# Patient Record
Sex: Female | Born: 1975 | Race: Black or African American | Hispanic: No | Marital: Single | Smoking: Never smoker
Health system: Southern US, Community
[De-identification: ages and names within clinical notes are randomized; demographics above are authoritative.]

## PROBLEM LIST (undated history)

## (undated) DIAGNOSIS — D649 Anemia, unspecified: Secondary | ICD-10-CM

## (undated) HISTORY — DX: Anemia, unspecified: D64.9

## (undated) HISTORY — PX: TUBAL LIGATION: SHX77

## (undated) HISTORY — PX: NO PAST SURGERIES: SHX2092

---

## 2004-05-26 ENCOUNTER — Emergency Department: Payer: Self-pay | Admitting: Internal Medicine

## 2005-01-28 ENCOUNTER — Emergency Department: Payer: Self-pay | Admitting: Emergency Medicine

## 2005-03-18 ENCOUNTER — Ambulatory Visit (HOSPITAL_COMMUNITY): Admission: AD | Admit: 2005-03-18 | Discharge: 2005-03-18 | Payer: Self-pay | Admitting: Obstetrics & Gynecology

## 2005-05-13 ENCOUNTER — Emergency Department: Payer: Self-pay | Admitting: Emergency Medicine

## 2005-06-01 ENCOUNTER — Emergency Department (HOSPITAL_COMMUNITY): Admission: EM | Admit: 2005-06-01 | Discharge: 2005-06-01 | Payer: Self-pay | Admitting: Emergency Medicine

## 2006-08-29 ENCOUNTER — Emergency Department: Payer: Self-pay | Admitting: Emergency Medicine

## 2006-11-08 ENCOUNTER — Emergency Department: Payer: Self-pay | Admitting: Emergency Medicine

## 2007-04-23 ENCOUNTER — Emergency Department: Payer: Self-pay | Admitting: Emergency Medicine

## 2007-04-28 ENCOUNTER — Emergency Department: Payer: Self-pay | Admitting: Emergency Medicine

## 2008-01-25 ENCOUNTER — Emergency Department: Payer: Self-pay | Admitting: Internal Medicine

## 2008-04-28 ENCOUNTER — Emergency Department: Payer: Self-pay | Admitting: Emergency Medicine

## 2008-06-21 ENCOUNTER — Emergency Department: Payer: Self-pay | Admitting: Emergency Medicine

## 2008-07-06 ENCOUNTER — Emergency Department: Payer: Self-pay | Admitting: Emergency Medicine

## 2015-01-18 ENCOUNTER — Emergency Department
Admission: EM | Admit: 2015-01-18 | Discharge: 2015-01-18 | Disposition: A | Discharge status: Home / Self Care | Payer: BLUE CROSS/BLUE SHIELD | Attending: Emergency Medicine | Admitting: Emergency Medicine

## 2015-01-18 ENCOUNTER — Encounter: Payer: Self-pay | Admitting: *Deleted

## 2015-01-18 DIAGNOSIS — J069 Acute upper respiratory infection, unspecified: Secondary | ICD-10-CM | POA: Diagnosis not present

## 2015-01-18 DIAGNOSIS — R05 Cough: Secondary | ICD-10-CM | POA: Diagnosis present

## 2015-01-18 MED ORDER — BENZONATATE 100 MG PO CAPS: 200.0000 mg | ORAL_CAPSULE | Freq: Three times a day (TID) | ORAL | Status: AC | PRN

## 2015-01-18 NOTE — ED Notes (Signed)
Assess per PA 

## 2015-01-18 NOTE — ED Provider Notes (Signed)
[facility] Emergency Department Provider Note  ____________________________________________  Time seen: Approximately 11:37 AM  I have reviewed the triage vital signs and the nursing notes.   HISTORY  Chief Complaint URI   HPI Alexis [name] is a 40 y.o. female is here with complaint of cough, congestion and nasal drainage for approximate 4 days. Patient is unaware of any fever. There's been no nausea, vomiting, or diarrhea. No one else in the family is sick with same symptoms. Patient has been taking Mucinex without any relief as well as some old home remedies. Patient is not a smoker, however she is around people who do smoke. Patient continues to eat and drink without difficulty.Currently she rates her discomfort as 6 out of 10.   History reviewed. No pertinent past medical history.  There are no active problems to display for this patient.   History reviewed. No pertinent past surgical history.  Current Outpatient Rx  Name  Route  Sig  Dispense  Refill  . benzonatate (TESSALON PERLES) 100 MG capsule   Oral   Take 2 capsules (200 mg total) by mouth 3 (three) times daily as needed for cough.   30 capsule   0     Allergies Review of patient's allergies indicates no known allergies.  No family history on file.  Social History Social History  Substance Use Topics  . Smoking status: Never Smoker   . Smokeless tobacco: None  . Alcohol Use: No    Review of Systems Constitutional: No fever/chills Eyes: No visual changes. ENT: Minimal sore throat. Positive nasal congestion. Cardiovascular: Denies chest pain. Respiratory: Denies shortness of breath. Nonproductive cough positive Gastrointestinal: No abdominal pain.  No nausea, no vomiting.  No diarrhea. Genitourinary: Negative for dysuria. Musculoskeletal: Negative for back pain. Skin: Negative for rash. Neurological: Negative for headaches, focal weakness or numbness.  10-point ROS  otherwise negative.  ____________________________________________   PHYSICAL EXAM:  VITAL SIGNS: ED Triage Vitals  Enc Vitals Group     BP 01/18/15 1036 119/67 mmHg     Pulse Rate 01/18/15 1036 98     Resp 01/18/15 1036 18     Temp 01/18/15 1036 98.3 F (36.8 C)     Temp Source 01/18/15 1036 Oral     SpO2 01/18/15 1036 98 %     Weight 01/18/15 1036 120 lb (54.432 kg)     Height 01/18/15 1036 5\' 7"  (1.702 m)     Head Cir --      Peak Flow --      Pain Score 01/18/15 1037 6     Pain Loc --      Pain Edu? --      Excl. in GC? --     Constitutional: Alert and oriented. Well appearing and in no acute distress. Eyes: Conjunctivae are normal. PERRL. EOMI. Head: Atraumatic. Nose: Mild congestion/no rhinnorhea. Mouth/Throat: Mucous membranes are moist.  Oropharynx non-erythematous. Positive posterior drainage. Neck: No stridor.   Hematological/Lymphatic/Immunilogical: No cervical lymphadenopathy. Cardiovascular: Normal rate, regular rhythm. Grossly normal heart sounds.  Good peripheral circulation. Respiratory: Normal respiratory effort.  No retractions. Lungs CTAB. Gastrointestinal: Soft and nontender. No distention.  Musculoskeletal: No lower extremity tenderness nor edema.  No joint effusions. Neurologic:  Normal speech and language. No gross focal neurologic deficits are appreciated. No gait instability. Skin:  Skin is warm, dry and intact. No rash noted. Psychiatric: Mood and affect are normal. Speech and behavior are normal.  ____________________________________________   LABS (all labs ordered are  listed, but only abnormal results are displayed)  Labs Reviewed - No data to display  PROCEDURES  Procedure(s) performed: None  Critical Care performed: No  ____________________________________________   INITIAL IMPRESSION / ASSESSMENT AND PLAN / ED COURSE  Pertinent labs & imaging results that were available during my care of the patient were reviewed by me and  considered in my medical decision making (see chart for details).  She is to obtain Zyrtec-D, Claritin-D, or Sudafed PE for nasal congestion. Patient was given a prescription for [facility] Perles as needed for cough. She [facility] or her primary care if any continued problems. ____________________________________________   FINAL CLINICAL IMPRESSION(S) / ED DIAGNOSES  Final diagnoses:  Acute upper respiratory infection      Tommi RumpsRhonda L Zaila Crew, PA-C 01/18/15 1149  Arnaldo NatalPaul F Malinda, MD 01/18/15 [phone]

## 2015-01-18 NOTE — Discharge Instructions (Signed)
Upper Respiratory Infection, Adult Most upper respiratory infections (URIs) are caused by a virus. A URI affects the nose, throat, and upper air passages. The most common type of URI is often called "the common cold." HOME CARE   Take medicines only as told by your doctor.  Gargle warm saltwater or take cough drops to comfort your throat as told by your doctor.  Use a warm mist humidifier or inhale steam from a shower to increase air moisture. This may make it easier to breathe.  Drink enough fluid to keep your pee (urine) clear or pale yellow.  Eat soups and other clear broths.  Have a healthy diet.  Rest as needed.  Go back to work when your fever is gone or your doctor says it is okay.  You may need to stay home longer to avoid giving your URI to others.  You can also wear a face mask and wash your hands often to prevent spread of the virus.  Use your inhaler more if you have asthma.  Do not use any tobacco products, including cigarettes, chewing tobacco, or electronic cigarettes. If you need help quitting, ask your doctor. GET HELP IF:  You are getting worse, not better.  Your symptoms are not helped by medicine.  You have chills.  You are getting more short of breath.  You have brown or red mucus.  You have yellow or brown discharge from your nose.  You have pain in your face, especially when you bend forward.  You have a fever.  You have puffy (swollen) neck glands.  You have pain while swallowing.  You have white areas in the back of your throat. GET HELP RIGHT AWAY IF:   You have very bad or constant:  Headache.  Ear pain.  Pain in your forehead, behind your eyes, and over your cheekbones (sinus pain).  Chest pain.  You have long-lasting (chronic) lung disease and any of the following:  Wheezing.  Long-lasting cough.  Coughing up blood.  A change in your usual mucus.  You have a stiff neck.  You have changes in  your:  Vision.  Hearing.  Thinking.  Mood. MAKE SURE YOU:   Understand these instructions.  Will watch your condition.  Will get help right away if you are not doing well or get worse.   This information is not intended to replace advice given to you by your health care provider. Make sure you discuss any questions you have with your health care provider.   Document Released: 06/11/2007 Document Revised: 05/09/2014 Document Reviewed: 03/30/2013 Elsevier Interactive Patient Education 2016 [employer]    Obtain Zyrtec-D, Hohenwaldlaritin-D, Sudafed PE over-the-counter for congestion. Tessalon Perles 1 or 2 every 8 hours as needed for cough. Increase fluids and [facility] if needed for any continued problems

## 2015-02-28 ENCOUNTER — Emergency Department
Admission: EM | Admit: 2015-02-28 | Discharge: 2015-02-28 | Disposition: A | Discharge status: Home / Self Care | Payer: BLUE CROSS/BLUE SHIELD | Attending: Emergency Medicine | Admitting: Emergency Medicine

## 2015-02-28 DIAGNOSIS — B349 Viral infection, unspecified: Secondary | ICD-10-CM | POA: Diagnosis not present

## 2015-02-28 DIAGNOSIS — R05 Cough: Secondary | ICD-10-CM | POA: Diagnosis present

## 2015-02-28 LAB — RAPID INFLUENZA A&B ANTIGENS
Influenza A (ARMC): NOT DETECTED
Influenza B (ARMC): NOT DETECTED

## 2015-02-28 MED ORDER — GUAIFENESIN-CODEINE 100-10 MG/5ML PO SOLN: 5.0000 mL | ORAL | Status: DC | PRN

## 2015-02-28 MED ORDER — ACETAMINOPHEN 500 MG PO TABS
1000.0000 mg | ORAL_TABLET | Freq: Once | ORAL | Status: AC
  Administered 2015-02-28: 1000 mg via ORAL
  Filled 2015-02-28: qty 2

## 2015-02-28 NOTE — ED Notes (Signed)
Pt in w/ complaints of body aches, cough since yesterday.

## 2015-02-28 NOTE — ED Notes (Signed)
Pt c/o body aches, chills, cough, congestion. Pt alert and oriented X4, active, cooperative, pt in NAD. RR even and unlabored, color WNL.

## 2015-02-28 NOTE — ED Provider Notes (Signed)
[facility] Emergency Department Provider Note  ____________________________________________  Time seen: Approximately 6:53 PM  I have reviewed the triage vital signs and the nursing notes.   HISTORY  Chief Complaint Cough and Generalized Body Aches   HPI Kaytlen Lightsey Mcmullan is a 40 y.o. female he had sudden onset of body aches and cough yesterday. Patient has developed chills and fever. She denies any vomiting or diarrhea. Cough is nonproductive. Patient did not get the flu shot. She also has not taken anything for her fever prior to her arrival in the emergency room.   History reviewed. No pertinent past medical history.  There are no active problems to display for this patient.   History reviewed. No pertinent past surgical history.  Current Outpatient Rx  Name  Route  Sig  Dispense  Refill  . benzonatate (TESSALON PERLES) 100 MG capsule   Oral   Take 2 capsules (200 mg total) by mouth 3 (three) times daily as needed for cough.   30 capsule   0   . guaiFENesin-codeine 100-10 MG/5ML syrup   Oral   Take 5 mLs by mouth every 4 (four) hours as needed.   120 mL   0     Allergies Review of patient's allergies indicates no known allergies.  No family history on file.  Social History Social History  Substance Use Topics  . Smoking status: Never Smoker   . Smokeless tobacco: None  . Alcohol Use: No    Review of Systems Constitutional: Positive fever/chills Eyes: No visual changes. ENT: No sore throat. Cardiovascular: Denies chest pain. Respiratory: Denies shortness of breath. Occasional nonproductive cough. Gastrointestinal: No abdominal pain.  No nausea, no vomiting.  No diarrhea.  No constipation. Genitourinary: Negative for dysuria. Musculoskeletal: Otherwise body aches positive Skin: Negative for rash. Neurological: Negative for headaches, focal weakness or numbness.  10-point ROS otherwise  negative.  ____________________________________________   PHYSICAL EXAM:  VITAL SIGNS: ED Triage Vitals  Enc Vitals Group     BP 02/28/15 1842 106/49 mmHg     Pulse --      Resp 02/28/15 1842 20     Temp 02/28/15 1842 101.9 F (38.8 C)     Temp Source 02/28/15 1842 Oral     SpO2 02/28/15 1842 100 %     Weight 02/28/15 1842 120 lb (54.432 kg)     Height 02/28/15 1842  (1.702 m)     Head Cir --      Peak Flow --      Pain Score 02/28/15 1842 8     Pain Loc --      Pain Edu? --      Excl. in GC? --     Constitutional: Alert and oriented. Well appearing and in no acute distress. Eyes: Conjunctivae are normal. PERRL. EOMI. Head: Atraumatic. Nose: Mild congestion/rhinnorhea. Mouth/Throat: Mucous membranes are moist.  Oropharynx non-erythematous. Neck: No stridor.   Hematological/Lymphatic/Immunilogical: No cervical lymphadenopathy. Cardiovascular: Normal rate, regular rhythm. Grossly normal heart sounds.  Good peripheral circulation. Respiratory: Normal respiratory effort.  No retractions. Lungs CTAB. Gastrointestinal: Soft and nontender. No distention.  Musculoskeletal: No lower extremity tenderness nor edema.  No joint effusions. Neurologic:  Normal speech and language. No gross focal neurologic deficits are appreciated. No gait instability. Skin:  Skin is warm, dry and intact. No rash noted. Psychiatric: Mood and affect are normal. Speech and behavior are normal.  ____________________________________________   LABS (all labs ordered are listed, but only abnormal results are  displayed)  Labs Reviewed  RAPID INFLUENZA A&B ANTIGENS ([facility] ONLY)     PROCEDURES  Procedure(s) performed: None  Critical Care performed: No  ____________________________________________   INITIAL IMPRESSION / ASSESSMENT AND PLAN / ED COURSE  Pertinent labs & imaging results that were available during my care of the patient were reviewed by me and considered in my medical decision  making (see chart for details).  Patient was informed that this is more viral and that her influenza test came back negative. Patient is to increase fluids, take Tylenol or ibuprofen as needed for fever and body aches and she was prescribed medication for controlling her cough. She is follow-up with her doctor if any continued problems. ____________________________________________   FINAL CLINICAL IMPRESSION(S) / ED DIAGNOSES  Final diagnoses:  Viral illness      Tommi Rumps, PA-C 02/28/15 2105  Rockne Menghini, MD 02/28/15 [phone]

## 2015-02-28 NOTE — Discharge Instructions (Signed)
Viral Infections A virus is a type of germ. Viruses can cause:  Minor sore throats.  Aches and pains.  Headaches.  Runny nose.  Rashes.  Watery eyes.  Tiredness.  Coughs.  Loss of appetite.  Feeling sick to your stomach (nausea).  Throwing up (vomiting).  Watery poop (diarrhea). HOME CARE   Only take medicines as told by your doctor.  Drink enough water and fluids to keep your pee (urine) clear or pale yellow. Sports drinks are a good choice.  Get plenty of rest and eat healthy. Soups and broths with crackers or rice are fine. GET HELP RIGHT AWAY IF:   You have a very bad headache.  You have shortness of breath.  You have chest pain or neck pain.  You have an unusual rash.  You cannot stop throwing up.  You have watery poop that does not stop.  You cannot keep fluids down.  You or your child has a temperature by mouth above 102 F (38.9 C), not controlled by medicine.  Your baby is older than 3 months with a rectal temperature of 102 F (38.9 C) or higher.  Your baby is 63 months old or younger with a rectal temperature of 100.4 F (38 C) or higher. MAKE SURE YOU:   Understand these instructions.  Will watch this condition.  Will get help right away if you are not doing well or get worse.   This information is not intended to replace advice given to you by your health care provider. Make sure you discuss any questions you have with your health care provider.   Document Released: 12/06/2007 Document Revised: 03/17/2011 Document Reviewed: 05/31/2014 Elsevier Interactive Patient Education 2016 [employer]vier Inc.   Tylenol or ibuprofen as needed for fever and aches. Increase fluids. Robitussin-AC as needed for cough. Follow-up with your doctor at [facility] if any continued problems.

## 2016-10-22 ENCOUNTER — Encounter: Payer: Self-pay | Admitting: Emergency Medicine

## 2016-10-22 ENCOUNTER — Emergency Department
Admission: EM | Admit: 2016-10-22 | Discharge: 2016-10-22 | Disposition: A | Discharge status: Home / Self Care | Payer: 59 | Attending: Emergency Medicine | Admitting: Emergency Medicine

## 2016-10-22 DIAGNOSIS — K0889 Other specified disorders of teeth and supporting structures: Secondary | ICD-10-CM | POA: Diagnosis present

## 2016-10-22 MED ORDER — LIDOCAINE VISCOUS 2 % MT SOLN
15.0000 mL | Freq: Once | OROMUCOSAL | Status: AC
  Administered 2016-10-22: 15 mL via OROMUCOSAL
  Filled 2016-10-22: qty 15

## 2016-10-22 MED ORDER — AMOXICILLIN 500 MG PO CAPS
500.0000 mg | ORAL_CAPSULE | Freq: Once | ORAL | Status: AC
  Administered 2016-10-22: 500 mg via ORAL
  Filled 2016-10-22: qty 1

## 2016-10-22 MED ORDER — OXYCODONE-ACETAMINOPHEN 5-325 MG PO TABS: 1.0000 | ORAL_TABLET | ORAL | 0 refills | Status: DC | PRN

## 2016-10-22 MED ORDER — KETOROLAC TROMETHAMINE 10 MG PO TABS
10.0000 mg | ORAL_TABLET | Freq: Once | ORAL | Status: AC
  Administered 2016-10-22: 10 mg via ORAL
  Filled 2016-10-22: qty 1

## 2016-10-22 MED ORDER — AMOXICILLIN 875 MG PO TABS: 875.0000 mg | ORAL_TABLET | Freq: Two times a day (BID) | ORAL | 0 refills | Status: AC

## 2016-10-22 NOTE — ED Triage Notes (Signed)
Patient ambulatory to triage with steady gait, without difficulty or distress noted; pt reports right lower dental pain x 2 days 

## 2016-10-24 NOTE — ED Provider Notes (Signed)
[facility] Emergency Department Provider Note   First MD Initiated Contact with Patient 10/22/16 [phone]     (approximate)  I have reviewed the triage vital signs and the nursing notes.   HISTORY  Chief Complaint Dental Pain   HPI Alexis [name] is a 41 y.o. female presents to the emergency department with 10 out of 10 right posterior mandible molar pain 2 days. Patient denies any fever. Patient states filling in the tooth was dislodged. Patient denies any difficulty swallowing no throat painor swelling.   Past medical history None There are no active problems to display for this patient.   Past surgical history None  Prior to Admission medications   Medication Sig Start Date End Date Taking? Authorizing Provider  amoxicillin (AMOXIL) 875 MG tablet Take 1 tablet (875 mg total) by mouth 2 (two) times daily. 10/22/16 11/01/16  Darci Current, MD  guaiFENesin-codeine 100-10 MG/5ML syrup Take 5 mLs by mouth every 4 (four) hours as needed. 02/28/15   Tommi Rumps, PA-C  oxyCODONE-acetaminophen (ROXICET) 5-325 MG tablet Take 1 tablet by mouth every 4 (four) hours as needed for severe pain. 10/22/16   Darci Current, MD    Allergies Patient has no known allergies.  No family history on file.  Social History Social History  Substance Use Topics  . Smoking status: Never Smoker  . Smokeless tobacco: Never Used  . Alcohol use No    Review of Systems Constitutional: No fever/chills Eyes: No visual changes. ENT: No sore throat.positive for dental pain Cardiovascular: Denies chest pain. Respiratory: Denies shortness of breath. Gastrointestinal: No abdominal pain.  No nausea, no vomiting.  No diarrhea.  No constipation. Genitourinary: Negative for dysuria. Musculoskeletal: Negative for neck pain.  Negative for back pain. Integumentary: Negative for rash. Neurological: Negative for headaches, focal weakness or  numbness.   ____________________________________________   PHYSICAL EXAM:  VITAL SIGNS: ED Triage Vitals  Enc Vitals Group     BP 10/22/16 0247 122/77     Pulse Rate 10/22/16 0247 89     Resp 10/22/16 0247 18     Temp 10/22/16 0247 98.8 F (37.1 C)     Temp Source 10/22/16 0247 Oral     SpO2 10/22/16 0247 100 %     Weight 10/22/16 0245 63.5 kg (140 lb)     Height 10/22/16 0245 1.702 m (5\' 7" )     Head Circumference --      Peak Flow --      Pain Score 10/22/16 0245 10     Pain Loc --      Pain Edu? --      Excl. in GC? --     Constitutional: Alert and oriented. Well appearing and in no acute distress. Eyes: Conjunctivae are normal.  Head: Atraumatic. Mouth/Throat: Mucous membranes are moist.  Oropharynx non-erythematous.right posterior mandible molar dental carry with apparent loss of filling. Surrounding gumline unremarkable Neck: No stridor.  No meningeal signsno palpable lymphadenopathy Cardiovascular: Normal rate, regular rhythm. Good peripheral circulation. Grossly normal heart sounds. Respiratory: Normal respiratory effort.  No retractions. Lungs CTAB. Neurologic:  Normal speech and language. No gross focal neurologic deficits are appreciated.  Psychiatric: Mood and affect are normal. Speech and behavior are normal.  ____________________________________________   LABS (all labs ordered are listed, but only abnormal results are displayed)  Labs Reviewed - No data to display  Procedures   ____________________________________________   INITIAL IMPRESSION / ASSESSMENT AND PLAN / ED COURSE  As  part of my medical decision making, I reviewed the following data within the electronic MEDICAL RECORD [id number] year old female presenting with dental [place] secondary to dislodge filling. No signs of Ludwig angina or soft tissue infection of the neck. Patient given Toradol viscous lidocaine swish and spit emergency Department with improvement of pain.patient advised to follow up  with dentist as planned.     ____________________________________________  FINAL CLINICAL IMPRESSION(S) / ED DIAGNOSES  Final diagnoses:  Pain, dental     MEDICATIONS GIVEN DURING THIS VISIT:  Medications  ketorolac (TORADOL) tablet 10 mg (10 mg Oral Given 10/22/16 0408)  lidocaine (XYLOCAINE) 2 % viscous mouth solution 15 mL (15 mLs Mouth/Throat Given 10/22/16 0408)  amoxicillin (AMOXIL) capsule 500 mg (500 mg Oral Given 10/22/16 0408)     NEW OUTPATIENT MEDICATIONS STARTED DURING THIS VISIT:  Discharge Medication List as of 10/22/2016  4:32 AM    START taking these medications   Details  amoxicillin (AMOXIL) 875 MG tablet Take 1 tablet (875 mg total) by mouth 2 (two) times daily., Starting Wed 10/22/2016, Until Sat 11/01/2016, Print    oxyCODONE-acetaminophen (ROXICET) 5-325 MG tablet Take 1 tablet by mouth every 4 (four) hours as needed for severe pain., Starting Wed 10/22/2016, Print        Discharge Medication List as of 10/22/2016  4:32 AM      Discharge Medication List as of 10/22/2016  4:32 AM       Note:  This document was prepared using Dragon voice recognition software and may include unintentional dictation errors.    Darci CurrentBrown, Augusta N, MD 10/24/16 [phone]

## 2017-12-28 ENCOUNTER — Other Ambulatory Visit: Payer: Self-pay

## 2017-12-28 ENCOUNTER — Emergency Department
Admission: EM | Admit: 2017-12-28 | Discharge: 2017-12-28 | Disposition: A | Discharge status: Home / Self Care | Payer: 59 | Attending: Emergency Medicine | Admitting: Emergency Medicine

## 2017-12-28 ENCOUNTER — Emergency Department: Payer: 59

## 2017-12-28 ENCOUNTER — Encounter: Payer: Self-pay | Admitting: Emergency Medicine

## 2017-12-28 DIAGNOSIS — J40 Bronchitis, not specified as acute or chronic: Secondary | ICD-10-CM | POA: Insufficient documentation

## 2017-12-28 DIAGNOSIS — R05 Cough: Secondary | ICD-10-CM | POA: Diagnosis present

## 2017-12-28 MED ORDER — ALBUTEROL SULFATE HFA 108 (90 BASE) MCG/ACT IN AERS: 2.0000 | INHALATION_SPRAY | RESPIRATORY_TRACT | 0 refills | Status: DC | PRN

## 2017-12-28 MED ORDER — PSEUDOEPH-BROMPHEN-DM 30-2-10 MG/5ML PO SYRP: 10.0000 mL | ORAL_SOLUTION | Freq: Four times a day (QID) | ORAL | 0 refills | Status: DC | PRN

## 2017-12-28 MED ORDER — PREDNISONE 50 MG PO TABS: 50.0000 mg | ORAL_TABLET | Freq: Every day | ORAL | 0 refills | Status: DC

## 2017-12-28 NOTE — ED Triage Notes (Signed)
Pt presents to ED via POV with c/o cough, chills, and suspected fever. Per patient unknown max temp at home. Pt states symptoms approx 1 week. Pt with noted productive cough in triage.

## 2017-12-28 NOTE — ED Notes (Signed)
See triage note  Presents with subjective fever and cough  Afebrile on arrival   sxs' started about 1 week ago  States cough has been prod today

## 2017-12-28 NOTE — ED Provider Notes (Signed)
[facility] Emergency Department Provider Note  ____________________________________________  Time seen: Approximately 3:22 PM  I have reviewed the triage vital signs and the nursing notes.   HISTORY  Chief Complaint Cough; Chills; and Fever    HPI Alexis [name] is a 42 y.o. female who presents the emergency department complaining of nasal congestion, cough, subjective fever.  Patient reports that she has had symptoms for approximately a week.  Her main complaint is cough.  Patient has felt both hot and chilled today.  She is not taking any medications for his complaint.  She has tried Vicks vapor rub on her chest to relieve "chest congestion."  Patient denies any headache, neck pain or stiffness, chest pain, shortness of breath, abdominal pain, nausea or vomiting, diarrhea or constipation.    History reviewed. No pertinent past medical history.  There are no active problems to display for this patient.   History reviewed. No pertinent surgical history.  Prior to Admission medications   Medication Sig Start Date End Date Taking? Authorizing Provider  albuterol (PROVENTIL HFA;VENTOLIN HFA) 108 (90 Base) MCG/ACT inhaler Inhale 2 puffs into the lungs every 4 (four) hours as needed for wheezing or shortness of breath. 12/28/17   Celisa Schoenberg, Delorise RoyalsJonathan D, PA-C  brompheniramine-pseudoephedrine-DM 30-2-10 MG/5ML syrup Take 10 mLs by mouth 4 (four) times daily as needed. 12/28/17   Gerron Guidotti, Delorise RoyalsJonathan D, PA-C  guaiFENesin-codeine 100-10 MG/5ML syrup Take 5 mLs by mouth every 4 (four) hours as needed. 02/28/15   Tommi RumpsSummers, Rhonda L, PA-C  oxyCODONE-acetaminophen (ROXICET) 5-325 MG tablet Take 1 tablet by mouth every 4 (four) hours as needed for severe pain. 10/22/16   Darci CurrentBrown, [place] N, MD  predniSONE (DELTASONE) 50 MG tablet Take 1 tablet (50 mg total) by mouth daily with breakfast. 12/28/17   Luis Nickles, Delorise RoyalsJonathan D, PA-C    Allergies Patient has no known  allergies.  No family history on file.  Social History Social History   Tobacco Use  . Smoking status: Never Smoker  . Smokeless tobacco: Never Used  Substance Use Topics  . Alcohol use: No  . Drug use: Never     Review of Systems  Constitutional: Subjective fever/chills Eyes: No visual changes. No discharge ENT: Positive for nasal congestion Cardiovascular: no chest pain. Respiratory: Positive cough. No SOB. Gastrointestinal: No abdominal pain.  No nausea, no vomiting.  No diarrhea.  No constipation. Musculoskeletal: Negative for musculoskeletal pain. Skin: Negative for rash, abrasions, lacerations, ecchymosis. Neurological: Negative for headaches, focal weakness or numbness. 10-point ROS otherwise negative.  ____________________________________________   PHYSICAL EXAM:  VITAL SIGNS: ED Triage Vitals [12/28/17 1345]  Enc Vitals Group     BP (!) 120/57     Pulse Rate 77     Resp 18     Temp 98.3 F (36.8 C)     Temp Source Oral     SpO2 100 %     Weight 120 lb (54.4 kg)     Height 5\' 7"  (1.702 m)     Head Circumference      Peak Flow      Pain Score 6     Pain Loc      Pain Edu?      Excl. in GC?      Constitutional: Alert and oriented. Well appearing and in no acute distress. Eyes: Conjunctivae are normal. PERRL. EOMI. Head: Atraumatic. ENT:      Ears: EACs and TMs unremarkable bilaterally.      Nose: No congestion/rhinnorhea.  Mouth/Throat: Mucous membranes are moist.  Oropharynx is nonerythematous and nonedematous.  Uvula is midline. Neck: No stridor.  Neck is supple full range of motion Hematological/Lymphatic/Immunilogical: Scattered, mobile, nontender anterior cervical lymphadenopathy. Cardiovascular: Normal rate, regular rhythm. Normal S1 and S2.  Good peripheral circulation. Respiratory: Normal respiratory effort without tachypnea or retractions. Lungs with mild expiratory wheezes scattered bilaterally.  No rales or rhonchi.Peri [place] air entry  to the bases with no decreased or absent breath sounds. Musculoskeletal: Full range of motion to all extremities. No gross deformities appreciated. Neurologic:  Normal speech and language. No gross focal neurologic deficits are appreciated.  Skin:  Skin is warm, dry and intact. No rash noted. Psychiatric: Mood and affect are normal. Speech and behavior are normal. Patient exhibits appropriate insight and judgement.   ____________________________________________   LABS (all labs ordered are listed, but only abnormal results are displayed)  Labs Reviewed - No data to display ____________________________________________  EKG   ____________________________________________  RADIOLOGY I personally viewed and evaluated these images as part of my medical decision making, as well as reviewing the written report by the radiologist.  Dg Chest 2 View  Result Date: 12/28/2017 CLINICAL DATA:  Cough for a couple days. Chest congestion for 1-2 weeks. EXAM: CHEST - 2 VIEW COMPARISON:  None. FINDINGS: The heart size and mediastinal contours are normal. The lungs are clear. There is no pleural effusion or pneumothorax. No acute osseous findings are identified. IMPRESSION: No active cardiopulmonary process. Electronically Signed   By: Carey Bullocks M.D.   On: 12/28/2017 15:43    ____________________________________________    PROCEDURES  Procedure(s) performed:    Procedures    Medications - No data to display   ____________________________________________   INITIAL IMPRESSION / ASSESSMENT AND PLAN / ED COURSE  Pertinent labs & imaging results that were available during my care of the patient were reviewed by me and considered in my medical decision making (see chart for details).  Review of the [place] CSRS was performed in accordance of the [facility] prior to dispensing any controlled drugs.      Patient's diagnosis is consistent with bronchitis.  Patient presents emergency department  with 1 week history of cough.  Patient states that she has a subjective fever.  No fever here.  No antipyretic use.  Patient does have some scattered wheezing.  Chest x-ray reveals no consolidation concerning for pneumonia.. Patient will be discharged home with prescriptions for prednisone, albuterol, Bromfed cough syrup. Patient is to follow up with primary care as needed or otherwise directed. Patient is given ED precautions to return to the ED for any worsening or new symptoms.     ____________________________________________  FINAL CLINICAL IMPRESSION(S) / ED DIAGNOSES  Final diagnoses:  Bronchitis      NEW MEDICATIONS STARTED DURING THIS VISIT:  ED Discharge Orders         Ordered    albuterol (PROVENTIL HFA;VENTOLIN HFA) 108 (90 Base) MCG/ACT inhaler  Every 4 hours PRN     12/28/17 1604    brompheniramine-pseudoephedrine-DM 30-2-10 MG/5ML syrup  4 times daily PRN     12/28/17 1604    predniSONE (DELTASONE) 50 MG tablet  Daily with breakfast     12/28/17 1604              This chart was dictated using voice recognition software/Dragon. Despite best efforts to proofread, errors can occur which can change the meaning. Any change was purely unintentional.    Racheal Patches, PA-C 12/28/17 1608  Phineas SemenGoodman, [place], MD 12/28/17 2004

## 2018-08-30 ENCOUNTER — Other Ambulatory Visit: Payer: Self-pay

## 2018-08-30 ENCOUNTER — Encounter: Payer: Self-pay | Admitting: Emergency Medicine

## 2018-08-30 ENCOUNTER — Emergency Department
Admission: EM | Admit: 2018-08-30 | Discharge: 2018-08-30 | Disposition: A | Discharge status: Home / Self Care | Payer: 59 | Attending: Emergency Medicine | Admitting: Emergency Medicine

## 2018-08-30 ENCOUNTER — Emergency Department: Payer: 59

## 2018-08-30 DIAGNOSIS — G43909 Migraine, unspecified, not intractable, without status migrainosus: Secondary | ICD-10-CM | POA: Diagnosis not present

## 2018-08-30 DIAGNOSIS — Z79899 Other long term (current) drug therapy: Secondary | ICD-10-CM | POA: Diagnosis not present

## 2018-08-30 DIAGNOSIS — R51 Headache: Secondary | ICD-10-CM | POA: Diagnosis present

## 2018-08-30 MED ORDER — KETOROLAC TROMETHAMINE 30 MG/ML IJ SOLN
30.0000 mg | Freq: Once | INTRAMUSCULAR | Status: AC
  Administered 2018-08-30: 16:00:00 30 mg via INTRAVENOUS
  Filled 2018-08-30: qty 1

## 2018-08-30 MED ORDER — DEXTROSE 5 % IV SOLN
20.0000 mg | Freq: Once | INTRAVENOUS | Status: AC
  Administered 2018-08-30: 16:00:00 20 mg via INTRAVENOUS
  Filled 2018-08-30: qty 4

## 2018-08-30 MED ORDER — DIPHENHYDRAMINE HCL 50 MG/ML IJ SOLN
25.0000 mg | Freq: Once | INTRAMUSCULAR | Status: AC
  Administered 2018-08-30: 16:00:00 25 mg via INTRAVENOUS
  Filled 2018-08-30: qty 1

## 2018-08-30 NOTE — ED Provider Notes (Signed)
[facility] Emergency Department Provider Note   ____________________________________________    I have reviewed the triage vital signs and the nursing notes.   HISTORY  Chief Complaint Headache     HPI Alexis [name] is a 43 y.o. female presents with complaints of a headache.  She describes it as a frontal throbbing headache that has been ongoing for the last 24 hours.  She reports she had a similar headache several years ago that took days to go away.  Denies head injury.  No neuro deficits.  No change in vision.  No nausea or vomiting.  No blood thinners.  Has taken over-the-counter medications with little improvement.  History reviewed. No pertinent past medical history.  There are no active problems to display for this patient.   History reviewed. No pertinent surgical history.  Prior to Admission medications   Medication Sig Start Date End Date Taking? Authorizing Provider  albuterol (PROVENTIL HFA;VENTOLIN HFA) 108 (90 Base) MCG/ACT inhaler Inhale 2 puffs into the lungs every 4 (four) hours as needed for wheezing or shortness of breath. 12/28/17   Cuthriell, Charline Bills, PA-C  brompheniramine-pseudoephedrine-DM 30-2-10 MG/5ML syrup Take 10 mLs by mouth 4 (four) times daily as needed. 12/28/17   Cuthriell, Charline Bills, PA-C  guaiFENesin-codeine 100-10 MG/5ML syrup Take 5 mLs by mouth every 4 (four) hours as needed. 02/28/15   Johnn Hai, PA-C  oxyCODONE-acetaminophen (ROXICET) 5-325 MG tablet Take 1 tablet by mouth every 4 (four) hours as needed for severe pain. 10/22/16   Gregor Hams, MD  predniSONE (DELTASONE) 50 MG tablet Take 1 tablet (50 mg total) by mouth daily with breakfast. 12/28/17   Cuthriell, Charline Bills, PA-C     Allergies Patient has no known allergies.  No family history on file.  Social History Social History   Tobacco Use  . Smoking status: Never Smoker  . Smokeless tobacco: Never Used  Substance Use Topics   . Alcohol use: No  . Drug use: Never    Review of Systems  Constitutional: No fever/chills Eyes: No visual changes.  ENT: No neck pain Cardiovascular: Denies chest pain. Respiratory: Denies shortness of breath. Gastrointestinal: No nausea, no vomiting.   Genitourinary: Negative for dysuria. Musculoskeletal: No weakness Skin: Negative for rash. Neurological: As above   ____________________________________________   PHYSICAL EXAM:  VITAL SIGNS: ED Triage Vitals  Enc Vitals Group     BP 08/30/18 1418 (!) 113/58     Pulse Rate 08/30/18 1418 83     Resp 08/30/18 1418 16     Temp 08/30/18 1418 98.8 F (37.1 C)     Temp Source 08/30/18 1418 Oral     SpO2 08/30/18 1418 99 %     Weight 08/30/18 1418 59 kg (130 lb)     Height 08/30/18 1418 1.702 m (5\' 7" )     Head Circumference --      Peak Flow --      Pain Score 08/30/18 1417 10     Pain Loc --      Pain Edu? --      Excl. in [place]? --     Constitutional: Alert and oriented.  Eyes: Conjunctivae are normal.  PERRLA, EOMI Head: Atraumatic. Nose: No congestion/rhinnorhea. Mouth/Throat: Mucous membranes are moist.   Neck:  Painless ROM Cardiovascular: Normal rate, regular rhythm.   Good peripheral circulation. Respiratory: Normal respiratory effort.  No retractions.   Musculoskeletal: Normal strength in all extremities Neurologic:  Normal speech and language.  No gross focal neurologic deficits are appreciated.  Cranial nerves II through XII are normal Skin:  Skin is warm, dry and intact. No rash noted. Psychiatric: Mood and affect are normal. Speech and behavior are normal.  ____________________________________________   LABS (all labs ordered are listed, but only abnormal results are displayed)  Labs Reviewed - No data to display ____________________________________________  EKG  None ____________________________________________  RADIOLOGY  CT head unremarkable ____________________________________________    PROCEDURES  Procedure(s) performed: No  Procedures   Critical Care performed: No ____________________________________________   INITIAL IMPRESSION / ASSESSMENT AND PLAN / ED COURSE  Pertinent labs & imaging results that were available during my care of the patient were reviewed by me and considered in my medical decision making (see chart for details).  Patient presents with headache as described above, overall well-appearing without alarming signs or symptoms.  Given that she has not had a headache this in 2 years however will obtain imaging.  We will treat with Benadryl, Reglan and Toradol after CT  CT scan reassuring, Toradol added.  Patient had complete resolution of headache she feels well and is anxious to go home.  Return precautions discussed, outpatient follow-up with PCP recommended    ____________________________________________   FINAL CLINICAL IMPRESSION(S) / ED DIAGNOSES  Final diagnoses:  Migraine without status migrainosus, not intractable, unspecified migraine type        Note:  This document was prepared using Dragon voice recognition software and may include unintentional dictation errors.   Jene EveryKinner, Viveka Wilmeth, MD 08/30/18 [phone]

## 2018-08-30 NOTE — ED Notes (Signed)
Patient transported to CT 

## 2018-08-30 NOTE — ED Triage Notes (Signed)
Says has had cold sx, now says it hurts in head.

## 2018-08-30 NOTE — ED Triage Notes (Signed)
C/O headache to forehead x 2 days.  Denies photosensitivity.  Denies N/V  AAOx3.  Skin warm and dry. MAE equally and strong.  Gait steady. POsture upright and relaxed.  NAD

## 2019-01-12 ENCOUNTER — Ambulatory Visit
Admission: EM | Admit: 2019-01-12 | Discharge: 2019-01-12 | Disposition: A | Discharge status: Home / Self Care | Payer: 59 | Attending: Emergency Medicine | Admitting: Emergency Medicine

## 2019-01-12 ENCOUNTER — Other Ambulatory Visit: Payer: Self-pay

## 2019-01-12 DIAGNOSIS — R110 Nausea: Secondary | ICD-10-CM | POA: Diagnosis present

## 2019-01-12 DIAGNOSIS — R5383 Other fatigue: Secondary | ICD-10-CM | POA: Diagnosis present

## 2019-01-12 DIAGNOSIS — R52 Pain, unspecified: Secondary | ICD-10-CM

## 2019-01-12 DIAGNOSIS — Z79899 Other long term (current) drug therapy: Secondary | ICD-10-CM | POA: Diagnosis not present

## 2019-01-12 DIAGNOSIS — U071 COVID-19: Secondary | ICD-10-CM | POA: Diagnosis not present

## 2019-01-12 MED ORDER — ONDANSETRON HCL 8 MG PO TABS: 8.0000 mg | ORAL_TABLET | Freq: Three times a day (TID) | ORAL | 0 refills | Status: DC | PRN

## 2019-01-12 NOTE — ED Triage Notes (Signed)
Patient states that she has been having nausea when she wakes up x 4 days. Patient states that nausea lasts throughout the day. Reports that she has body aches as well. No episodes of emesis.

## 2019-01-12 NOTE — ED Provider Notes (Signed)
[facility]    CSN: [id number] Arrival date & time: 01/12/19  0941      History   Chief Complaint Chief Complaint  Patient presents with  . Nausea    HPI Alexis [name] is a 44 y.o. female.   44 year old female presents with nausea, body aches and chills for the past 4 days. Also has been sneezing along with fatigue and one episode of diarrhea. Denies any fever, nasal congestion, sore throat or coughing. Has not taken any medication for symptoms. No known exposure to COVID 19 but requests testing. No other chronic health issues. Takes no daily medication.   The history is provided by the patient.    History reviewed. No pertinent past medical history.  There are no problems to display for this patient.   Past Surgical History:  Procedure Laterality Date  . NO PAST SURGERIES      OB History   No obstetric history on file.      Home Medications    Prior to Admission medications   Medication Sig Start Date End Date Taking? Authorizing Provider  ondansetron (ZOFRAN) 8 MG tablet Take 1 tablet (8 mg total) by mouth every 8 (eight) hours as needed for nausea or vomiting. 01/12/19   Katy Apo, NP  albuterol (PROVENTIL HFA;VENTOLIN HFA) 108 (90 Base) MCG/ACT inhaler Inhale 2 puffs into the lungs every 4 (four) hours as needed for wheezing or shortness of breath. 12/28/17 01/12/19  Cuthriell, Charline Bills, PA-C    Family History History reviewed. No pertinent family history.  Social History Social History   Tobacco Use  . Smoking status: Never Smoker  . Smokeless tobacco: Never Used  Substance Use Topics  . Alcohol use: No  . Drug use: Never     Allergies   Patient has no known allergies.   Review of Systems Review of Systems  Constitutional: Positive for appetite change, chills and fatigue. Negative for activity change, diaphoresis and fever.  HENT: Positive for postnasal drip and sneezing. Negative for congestion, ear discharge, ear pain,  facial swelling, mouth sores, nosebleeds, sinus pressure, sinus pain, sore throat and trouble swallowing.   Eyes: Negative for pain, discharge, redness and itching.  Respiratory: Negative for cough, chest tightness, shortness of breath and wheezing.   Gastrointestinal: Positive for diarrhea and nausea. Negative for abdominal pain, blood in stool and vomiting.  Genitourinary: Negative for decreased urine volume and difficulty urinating.  Musculoskeletal: Positive for arthralgias and myalgias. Negative for back pain, neck pain and neck stiffness.  Skin: Negative for color change, rash and wound.  Allergic/Immunologic: Negative for environmental allergies, food allergies and immunocompromised state.  Neurological: Negative for dizziness, tremors, seizures, syncope, weakness, light-headedness, numbness and headaches.  Hematological: Negative for adenopathy. Does not bruise/bleed easily.     Physical Exam Triage Vital Signs ED Triage Vitals  Enc Vitals Group     BP 01/12/19 1005 108/61     Pulse Rate 01/12/19 1005 80     Resp 01/12/19 1005 16     Temp 01/12/19 1005 98.6 F (37 C)     Temp Source 01/12/19 1005 Oral     SpO2 01/12/19 1005 100 %     Weight 01/12/19 1003 130 lb (59 kg)     Height 01/12/19 1003 5\' 7"  (1.702 m)     Head Circumference --      Peak Flow --      Pain Score 01/12/19 1002 7     Pain Loc --  Pain Edu? --      Excl. in GC? --    No data found.  Updated Vital Signs BP 108/61 (BP Location: Left Arm)   Pulse 80   Temp 98.6 F (37 C) (Oral)   Resp 16   Ht 5\' 7"  (1.702 m)   Wt 130 lb (59 kg)   SpO2 100%   BMI 20.36 kg/m   Visual Acuity Right Eye Distance:   Left Eye Distance:   Bilateral Distance:    Right Eye Near:   Left Eye Near:    Bilateral Near:     Physical Exam Vitals and nursing note reviewed.  Constitutional:      General: She is awake. She is not in acute distress.    Appearance: She is well-developed and well-groomed.      Comments: Patient sitting comfortably on exam table in no acute distress.   HENT:     Head: Normocephalic and atraumatic.     Right Ear: Hearing, tympanic membrane, ear canal and external ear normal.     Left Ear: Hearing, tympanic membrane, ear canal and external ear normal.     Nose: Nose normal.     Right Sinus: No maxillary sinus tenderness or frontal sinus tenderness.     Left Sinus: No maxillary sinus tenderness or frontal sinus tenderness.     Mouth/Throat:     Lips: Pink.     Mouth: Mucous membranes are moist.     Pharynx: Oropharynx is clear. Uvula midline. No pharyngeal swelling, oropharyngeal exudate, posterior oropharyngeal erythema or uvula swelling.  Eyes:     Extraocular Movements: Extraocular movements intact.     Conjunctiva/sclera: Conjunctivae normal.  Cardiovascular:     Rate and Rhythm: Normal rate and regular rhythm.     Heart sounds: Normal heart sounds. No murmur.  Pulmonary:     Effort: Pulmonary effort is normal. No respiratory distress.     Breath sounds: Normal breath sounds and air entry. No decreased air movement. No decreased breath sounds, wheezing, rhonchi or rales.  Abdominal:     General: Abdomen is flat. Bowel sounds are normal.     Palpations: Abdomen is soft.     Tenderness: There is no abdominal tenderness. There is no right CVA tenderness or left CVA tenderness.  Musculoskeletal:        General: Normal range of motion.     Cervical back: Normal range of motion and neck supple. No rigidity.  Lymphadenopathy:     Cervical: No cervical adenopathy.  Skin:    General: Skin is warm and dry.     Capillary Refill: Capillary refill takes less than 2 seconds.     Findings: No rash.  Neurological:     General: No focal deficit present.     Mental Status: She is alert and oriented to person, place, and time.  Psychiatric:        Mood and Affect: Mood normal.        Behavior: Behavior normal. Behavior is cooperative.        Thought Content: Thought  content normal.        Judgment: Judgment normal.      UC Treatments / Results  Labs (all labs ordered are listed, but only abnormal results are displayed) Labs Reviewed  NOVEL CORONAVIRUS, NAA (HOSP ORDER, SEND-OUT TO REF LAB; TAT 18-24 HRS)    EKG   Radiology No results found.  Procedures Procedures (including critical care time)  Medications Ordered in UC Medications -  No data to display  Initial Impression / Assessment and Plan / UC Course  I have reviewed the triage vital signs and the nursing notes.  Pertinent labs & imaging results that were available during my care of the patient were reviewed by me and considered in my medical decision making (see chart for details).    Discussed with patient that she probably has a viral illness. Recommend Zofran 8mg  every 8 hours as needed for nausea. Slowly increase fluid intake and eat small, bland meals and advance diet as tolerated. Rest. Stay at home. Note written for work. Follow-up pending COVID 19 test results and in 3 to 4 days if not improving.   Final Clinical Impressions(s) / UC Diagnoses   Final diagnoses:  Nausea  Generalized body aches  Fatigue, unspecified type     Discharge Instructions     Recommend take Zofran 8mg  every 8 hours as needed for nausea. Slowly increase fluid intake and eat small, frequent meals. Rest. Stay at home. Follow-up pending COVID 19 test results.     ED Prescriptions    Medication Sig Dispense Auth. Provider   ondansetron (ZOFRAN) 8 MG tablet Take 1 tablet (8 mg total) by mouth every 8 (eight) hours as needed for nausea or vomiting. 15 tablet Saanvi Hakala, , NP     PDMP not reviewed this encounter.   , NP 01/12/19 2348

## 2019-01-12 NOTE — Discharge Instructions (Addendum)
Recommend take Zofran 8mg  every 8 hours as needed for nausea. Slowly increase fluid intake and eat small, frequent meals. Rest. Stay at home. Follow-up pending COVID 19 test results.

## 2019-01-13 LAB — NOVEL CORONAVIRUS, NAA (HOSP ORDER, SEND-OUT TO REF LAB; TAT 18-24 HRS): SARS-CoV-2, NAA: DETECTED — AB

## 2019-01-15 ENCOUNTER — Telehealth (HOSPITAL_COMMUNITY): Payer: Self-pay | Admitting: Emergency Medicine

## 2019-01-15 NOTE — Telephone Encounter (Signed)

## 2020-04-15 IMAGING — CT CT HEAD WITHOUT CONTRAST
3 series · 16 of 47 positions shown, 19 images · non-contrast
Comparison: None.

CLINICAL DATA: Frontal headache for the past 2 days.

EXAM:
CT HEAD WITHOUT CONTRAST
TECHNIQUE: Contiguous axial images were obtained from the base of the skull
through the vertex without intravenous contrast.

[Series 2: head wo · axial · 0.47mm/px · z∈[-156,-31]mm · 10 of 30 slices shown, 13 images]
[im 3/30  brain]
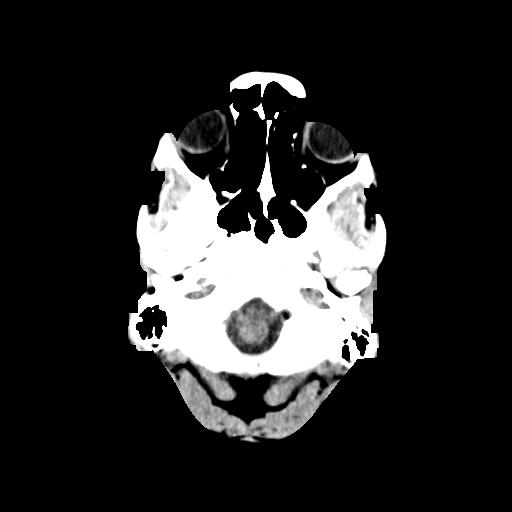
[im 3/30  bone]
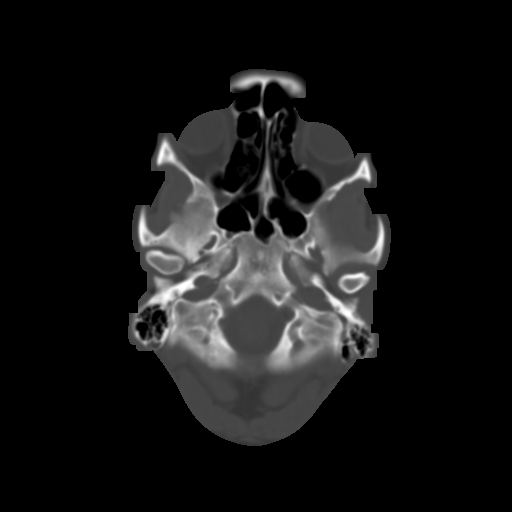
[im 6/30  brain]
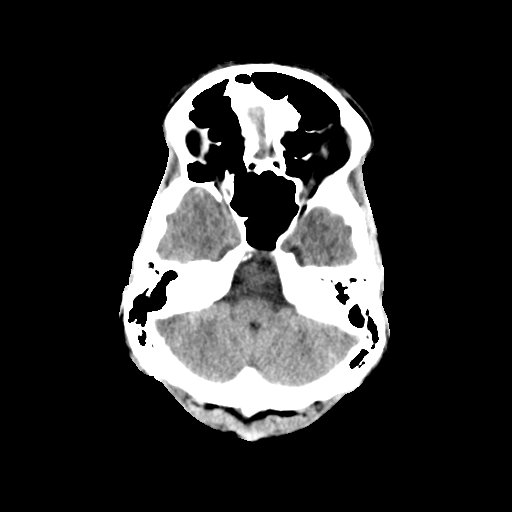
[im 9/30  brain]
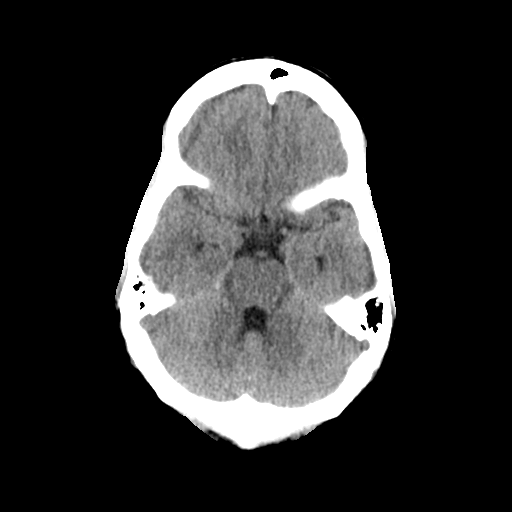
[im 11/30  brain]
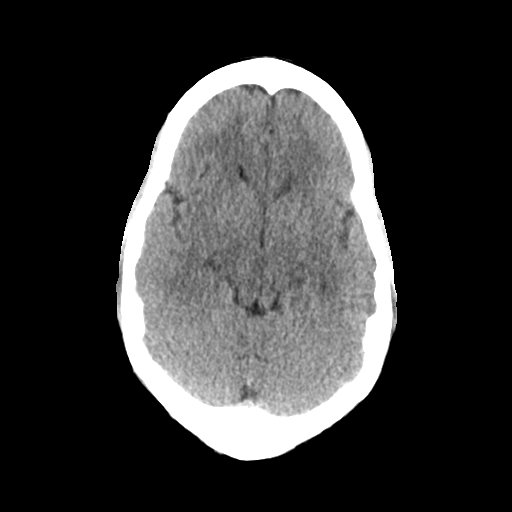
[im 14/30  brain]
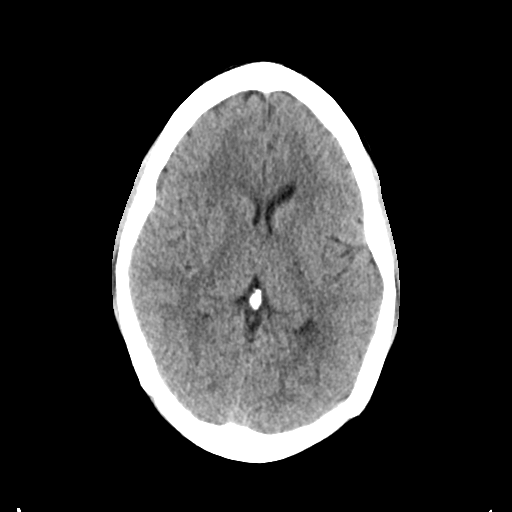
[im 14/30  bone]
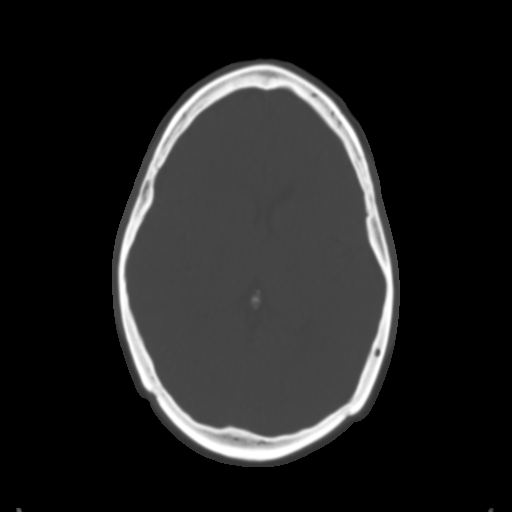
[im 17/30  brain]
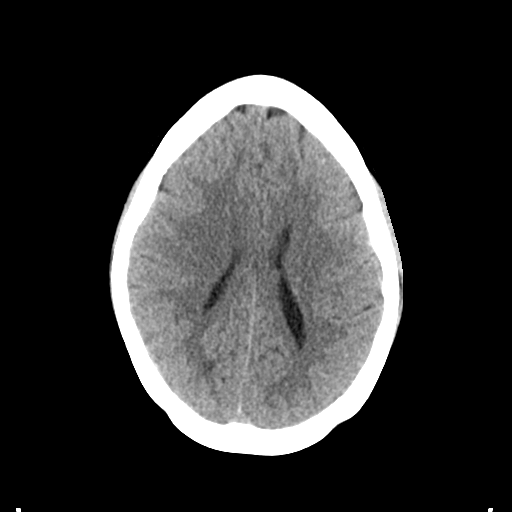
[im 20/30  brain]
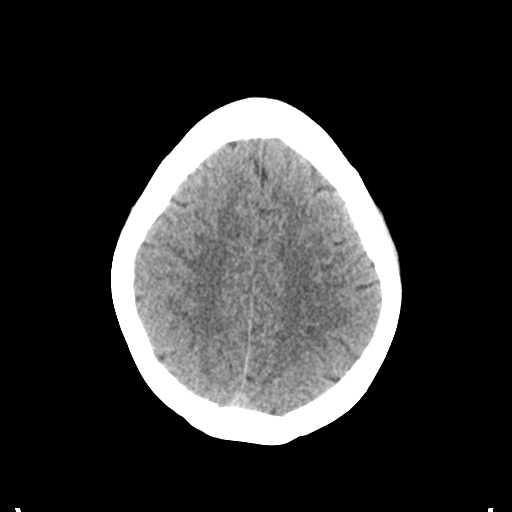
[im 23/30  brain]
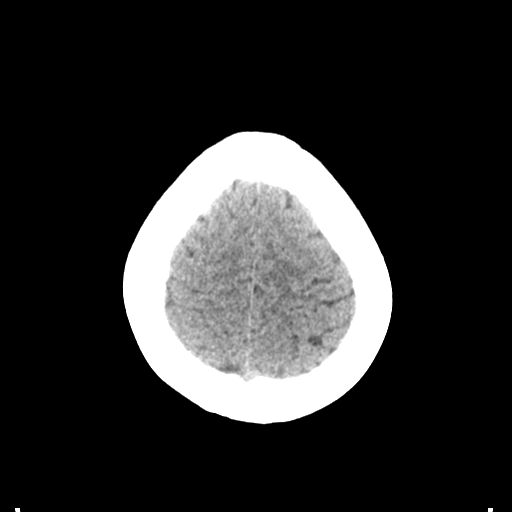
[im 25/30  brain]
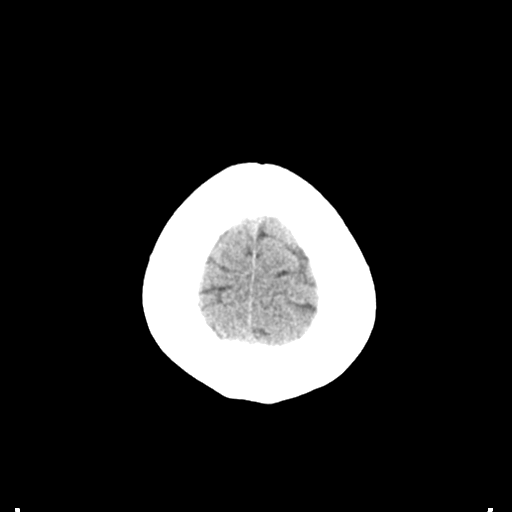
[im 25/30  bone]
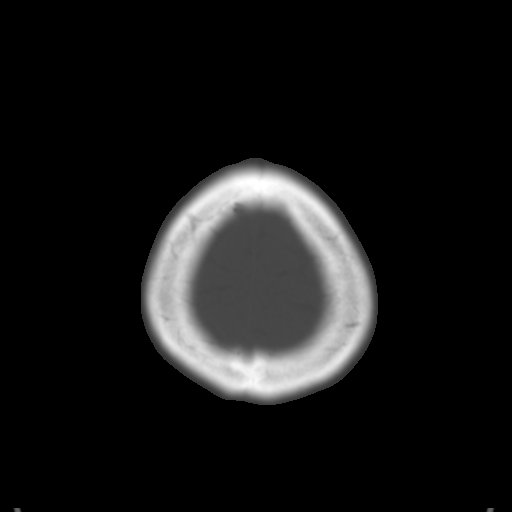
[im 28/30  brain]
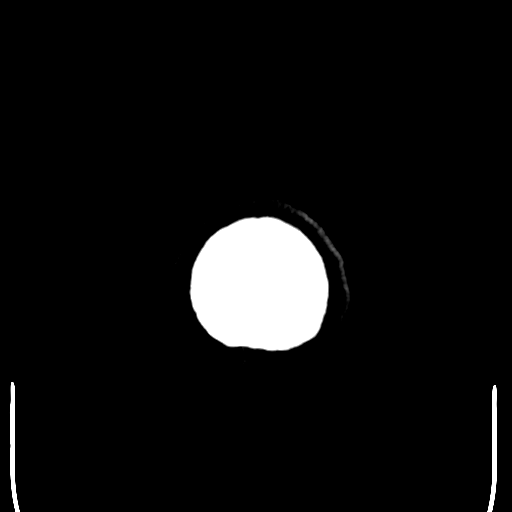

[Series 4: coronal soft tissue · coronal · 0.31mm/px · 3 of 63 slices shown]
[im 21/63  brain]
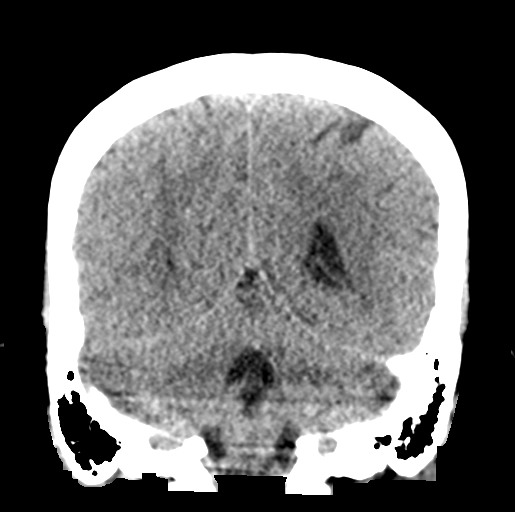
[im 28/63  brain]
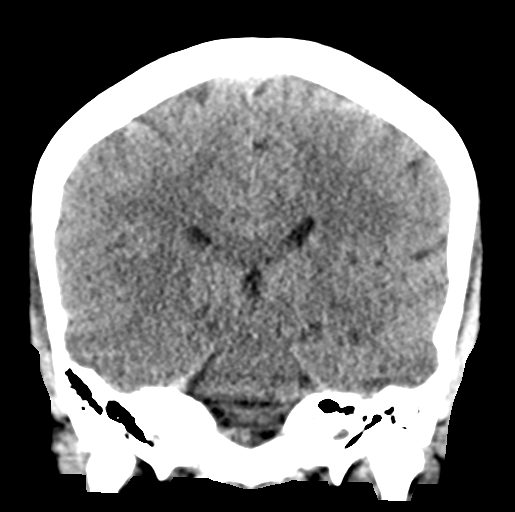
[im 35/63  brain]
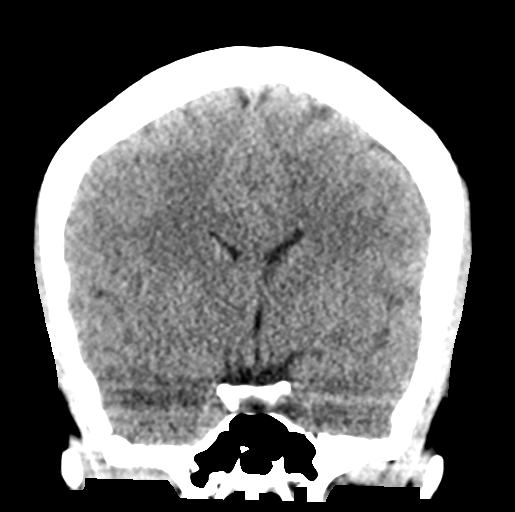

[Series 5: sagittal soft tissue · sagittal · 0.31mm/px · 3 of 50 slices shown]
[im 17/50  brain]
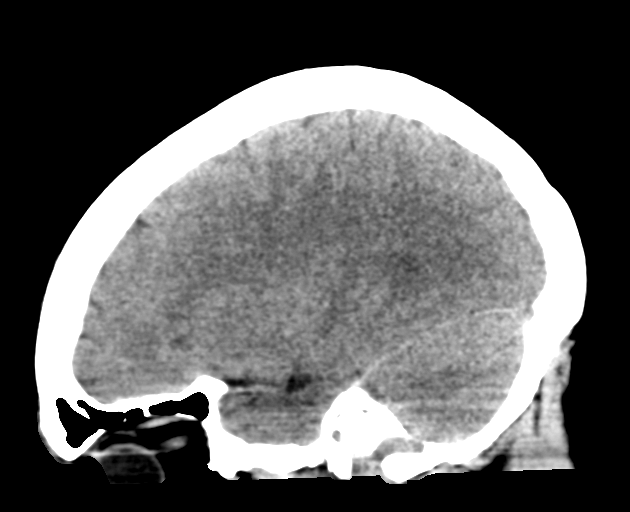
[im 25/50  brain]
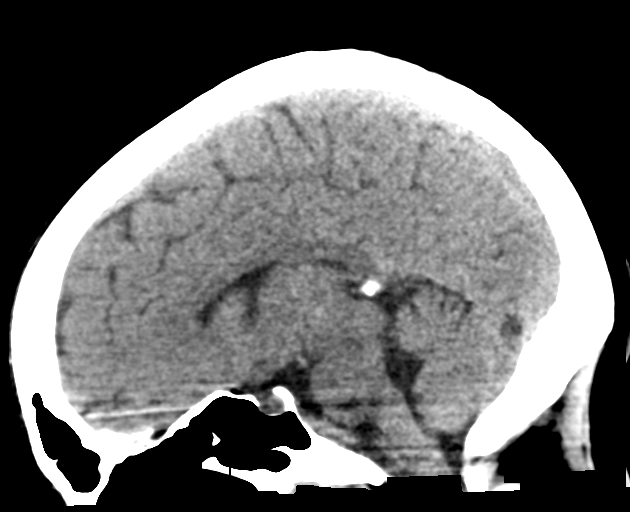
[im 33/50  brain]
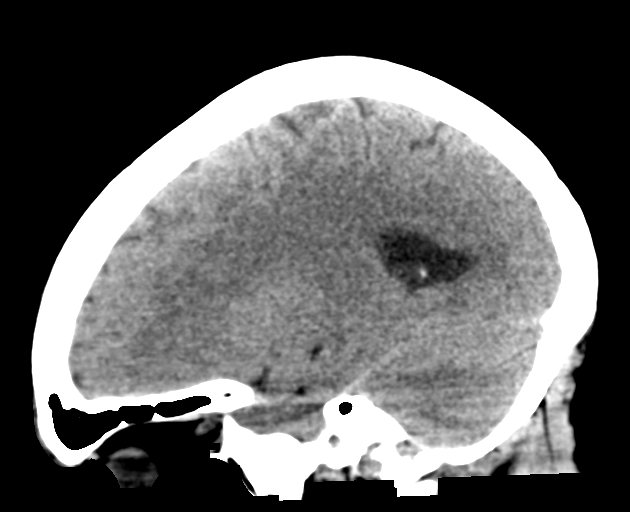

[16 of 47 positions shown; findings below may reference images not displayed]

FINDINGS: Brain: No evidence of acute infarction, hemorrhage, hydrocephalus,
extra-axial collection or mass lesion/mass effect.

Vascular: No hyperdense vessel or unexpected calcification.

Skull: Normal. Negative for fracture or focal lesion.

Sinuses/Orbits: No acute finding.

Other: None.
IMPRESSION: Normal noncontrast head CT.

## 2020-05-30 ENCOUNTER — Other Ambulatory Visit: Payer: Self-pay

## 2020-05-30 ENCOUNTER — Emergency Department
Admission: EM | Admit: 2020-05-30 | Discharge: 2020-05-30 | Disposition: A | Discharge status: Home / Self Care | Payer: 59 | Attending: Emergency Medicine | Admitting: Emergency Medicine

## 2020-05-30 DIAGNOSIS — M791 Myalgia, unspecified site: Secondary | ICD-10-CM | POA: Insufficient documentation

## 2020-05-30 DIAGNOSIS — M7918 Myalgia, other site: Secondary | ICD-10-CM

## 2020-05-30 DIAGNOSIS — Y92410 Unspecified street and highway as the place of occurrence of the external cause: Secondary | ICD-10-CM | POA: Insufficient documentation

## 2020-05-30 MED ORDER — OXYCODONE-ACETAMINOPHEN 7.5-325 MG PO TABS: 1.0000 | ORAL_TABLET | Freq: Four times a day (QID) | ORAL | 0 refills | Status: DC | PRN

## 2020-05-30 MED ORDER — ORPHENADRINE CITRATE ER 100 MG PO TB12: 100.0000 mg | ORAL_TABLET | Freq: Two times a day (BID) | ORAL | 0 refills | Status: DC

## 2020-05-30 MED ORDER — NAPROXEN 500 MG PO TABS: 500.0000 mg | ORAL_TABLET | Freq: Two times a day (BID) | ORAL | Status: DC

## 2020-05-30 NOTE — Discharge Instructions (Signed)
Read and follow discharge care instructions.  Take medication as directed. 
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
## 2021-12-10 ENCOUNTER — Emergency Department: Payer: BC Managed Care – PPO

## 2021-12-10 ENCOUNTER — Inpatient Hospital Stay
Admission: EM | Admit: 2021-12-10 | Discharge: 2021-12-13 | DRG: 812 | Disposition: A | Discharge status: Home / Self Care | Payer: BC Managed Care – PPO | Attending: Student | Admitting: Student

## 2021-12-10 DIAGNOSIS — D6959 Other secondary thrombocytopenia: Secondary | ICD-10-CM | POA: Diagnosis present

## 2021-12-10 DIAGNOSIS — J101 Influenza due to other identified influenza virus with other respiratory manifestations: Secondary | ICD-10-CM | POA: Diagnosis present

## 2021-12-10 DIAGNOSIS — Z79899 Other long term (current) drug therapy: Secondary | ICD-10-CM | POA: Diagnosis not present

## 2021-12-10 DIAGNOSIS — Z8249 Family history of ischemic heart disease and other diseases of the circulatory system: Secondary | ICD-10-CM

## 2021-12-10 DIAGNOSIS — Z1152 Encounter for screening for COVID-19: Secondary | ICD-10-CM | POA: Diagnosis not present

## 2021-12-10 DIAGNOSIS — D509 Iron deficiency anemia, unspecified: Principal | ICD-10-CM | POA: Diagnosis present

## 2021-12-10 DIAGNOSIS — E559 Vitamin D deficiency, unspecified: Secondary | ICD-10-CM | POA: Diagnosis present

## 2021-12-10 DIAGNOSIS — E876 Hypokalemia: Secondary | ICD-10-CM | POA: Diagnosis present

## 2021-12-10 DIAGNOSIS — D649 Anemia, unspecified: Secondary | ICD-10-CM | POA: Diagnosis not present

## 2021-12-10 DIAGNOSIS — E871 Hypo-osmolality and hyponatremia: Secondary | ICD-10-CM | POA: Diagnosis present

## 2021-12-10 DIAGNOSIS — N390 Urinary tract infection, site not specified: Secondary | ICD-10-CM | POA: Diagnosis present

## 2021-12-10 HISTORY — DX: Influenza due to other identified influenza virus with other respiratory manifestations: J10.1

## 2021-12-10 LAB — URINALYSIS, ROUTINE W REFLEX MICROSCOPIC
Bilirubin Urine: NEGATIVE
Glucose, UA: NEGATIVE mg/dL
Ketones, ur: 80 mg/dL — AB
Leukocytes,Ua: NEGATIVE
Nitrite: POSITIVE — AB
Protein, ur: 30 mg/dL — AB
Specific Gravity, Urine: 1.02 (ref 1.005–1.030)
pH: 6 (ref 5.0–8.0)

## 2021-12-10 LAB — LACTIC ACID, PLASMA
Lactic Acid, Venous: 1.3 mmol/L (ref 0.5–1.9)
Lactic Acid, Venous: 1.9 mmol/L (ref 0.5–1.9)

## 2021-12-10 LAB — PROTIME-INR
INR: 1.2 (ref 0.8–1.2)
Prothrombin Time: 15 seconds (ref 11.4–15.2)

## 2021-12-10 LAB — COMPREHENSIVE METABOLIC PANEL
ALT: 9 U/L (ref 0–44)
AST: 14 U/L — ABNORMAL LOW (ref 15–41)
Albumin: 4 g/dL (ref 3.5–5.0)
Alkaline Phosphatase: 60 U/L (ref 38–126)
Anion gap: 6 (ref 5–15)
BUN: 10 mg/dL (ref 6–20)
CO2: 24 mmol/L (ref 22–32)
Calcium: 8.9 mg/dL (ref 8.9–10.3)
Chloride: 102 mmol/L (ref 98–111)
Creatinine, Ser: 0.75 mg/dL (ref 0.44–1.00)
GFR, Estimated: >60 mL/min (ref >60)
Glucose, Bld: 123 mg/dL — ABNORMAL HIGH (ref 70–99)
Potassium: 3.4 mmol/L — ABNORMAL LOW (ref 3.5–5.1)
Sodium: 132 mmol/L — ABNORMAL LOW (ref 135–145)
Total Bilirubin: 1 mg/dL (ref 0.3–1.2)
Total Protein: 7.9 g/dL (ref 6.5–8.1)

## 2021-12-10 LAB — CBC WITH DIFFERENTIAL/PLATELET
Abs Immature Granulocytes: 0.03 10*3/uL (ref 0.00–0.07)
Basophils Absolute: 0 10*3/uL (ref 0.0–0.1)
Basophils Relative: 0 %
Eosinophils Absolute: 0 10*3/uL (ref 0.0–0.5)
Eosinophils Relative: 0 %
HCT: 22.5 % — ABNORMAL LOW (ref 36.0–46.0)
Hemoglobin: 6.2 g/dL — ABNORMAL LOW (ref 12.0–15.0)
Immature Granulocytes: 1 %
Lymphocytes Relative: 2 %
Lymphs Abs: 0.1 10*3/uL — ABNORMAL LOW (ref 0.7–4.0)
MCH: 17.7 pg — ABNORMAL LOW (ref 26.0–34.0)
MCHC: 27.6 g/dL — ABNORMAL LOW (ref 30.0–36.0)
MCV: 64.3 fL — ABNORMAL LOW (ref 80.0–100.0)
Monocytes Absolute: 0.5 10*3/uL (ref 0.1–1.0)
Monocytes Relative: 10 %
Neutro Abs: 4.3 10*3/uL (ref 1.7–7.7)
Neutrophils Relative %: 87 %
Platelets: 127 10*3/uL — ABNORMAL LOW (ref 150–400)
RBC: 3.5 MIL/uL — ABNORMAL LOW (ref 3.87–5.11)
RDW: 24.8 % — ABNORMAL HIGH (ref 11.5–15.5)
Smear Review: NORMAL
WBC: 4.9 10*3/uL (ref 4.0–10.5)
nRBC: 0 % (ref 0.0–0.2)

## 2021-12-10 LAB — FERRITIN: Ferritin: 6 ng/mL — ABNORMAL LOW (ref 11–307)

## 2021-12-10 LAB — TYPE AND SCREEN: ABO/RH(D): O POS

## 2021-12-10 LAB — IRON AND TIBC
Iron: 23 ug/dL — ABNORMAL LOW (ref 28–170)
Saturation Ratios: 5 % — ABNORMAL LOW (ref 10.4–31.8)
TIBC: 484 ug/dL — ABNORMAL HIGH (ref 250–450)
UIBC: 461 ug/dL

## 2021-12-10 LAB — PREPARE RBC (CROSSMATCH)

## 2021-12-10 LAB — RESP PANEL BY RT-PCR (FLU A&B, COVID) ARPGX2
Influenza A by PCR: POSITIVE — AB
Influenza B by PCR: NEGATIVE
SARS Coronavirus 2 by RT PCR: NEGATIVE

## 2021-12-10 LAB — POC URINE PREG, ED: Preg Test, Ur: NEGATIVE

## 2021-12-10 LAB — TROPONIN I (HIGH SENSITIVITY)
Troponin I (High Sensitivity): <2 ng/L (ref <18)
Troponin I (High Sensitivity): <2 ng/L (ref <18)

## 2021-12-10 LAB — ABO/RH: ABO/RH(D): O POS

## 2021-12-10 MED ORDER — KETOROLAC TROMETHAMINE 15 MG/ML IJ SOLN
15.0000 mg | Freq: Once | INTRAMUSCULAR | Status: AC
  Administered 2021-12-10: 15 mg via INTRAVENOUS
  Filled 2021-12-10: qty 1

## 2021-12-10 MED ORDER — SODIUM CHLORIDE 0.9 % IV SOLN: 10.0000 mL/h | Freq: Once | INTRAVENOUS | Status: DC

## 2021-12-10 MED ORDER — ACETAMINOPHEN 500 MG PO TABS
1000.0000 mg | ORAL_TABLET | Freq: Once | ORAL | Status: AC
  Administered 2021-12-10: 1000 mg via ORAL
  Filled 2021-12-10: qty 2

## 2021-12-10 MED ORDER — SODIUM CHLORIDE 0.9 % IV SOLN: INTRAVENOUS | Status: DC

## 2021-12-10 MED ORDER — LACTATED RINGERS IV BOLUS
1000.0000 mL | Freq: Once | INTRAVENOUS | Status: AC
  Administered 2021-12-10: 1000 mL via INTRAVENOUS

## 2021-12-10 MED ORDER — TRAZODONE HCL 50 MG PO TABS: 25.0000 mg | ORAL_TABLET | Freq: Every evening | ORAL | Status: DC | PRN

## 2021-12-10 MED ORDER — ONDANSETRON HCL 4 MG/2ML IJ SOLN: 4.0000 mg | Freq: Four times a day (QID) | INTRAMUSCULAR | Status: DC | PRN

## 2021-12-10 MED ORDER — IOHEXOL 300 MG/ML  SOLN
100.0000 mL | Freq: Once | INTRAMUSCULAR | Status: AC | PRN
  Administered 2021-12-10: 100 mL via INTRAVENOUS

## 2021-12-10 MED ORDER — ACETAMINOPHEN 650 MG RE SUPP: 650.0000 mg | Freq: Four times a day (QID) | RECTAL | Status: DC | PRN

## 2021-12-10 MED ORDER — MAGNESIUM HYDROXIDE 400 MG/5ML PO SUSP: 30.0000 mL | Freq: Every day | ORAL | Status: DC | PRN

## 2021-12-10 MED ORDER — OSELTAMIVIR PHOSPHATE 75 MG PO CAPS
75.0000 mg | ORAL_CAPSULE | Freq: Two times a day (BID) | ORAL | Status: DC
  Administered 2021-12-10 – 2021-12-13 (×6): 75 mg via ORAL
  Filled 2021-12-10 (×6): qty 1

## 2021-12-10 MED ORDER — ONDANSETRON HCL 4 MG PO TABS: 4.0000 mg | ORAL_TABLET | Freq: Four times a day (QID) | ORAL | Status: DC | PRN

## 2021-12-10 MED ORDER — PANTOPRAZOLE SODIUM 40 MG PO TBEC
40.0000 mg | DELAYED_RELEASE_TABLET | Freq: Every day | ORAL | Status: DC
  Administered 2021-12-11 – 2021-12-13 (×3): 40 mg via ORAL
  Filled 2021-12-10 (×3): qty 1

## 2021-12-10 MED ORDER — ACETAMINOPHEN 325 MG PO TABS: 650.0000 mg | ORAL_TABLET | Freq: Four times a day (QID) | ORAL | Status: DC | PRN

## 2021-12-10 NOTE — ED Triage Notes (Signed)
To triage via wheelchair with c/o chest pain and body aches. Onset this morning. Pt reports diaphoresis and possible fever at home but unsure. Chest pain localized to right side/rib area. Non productive cough. Endorses nausea and vomiting.

## 2021-12-10 NOTE — Assessment & Plan Note (Addendum)
-   We will place on p.o. Tamiflu. - She will be hydrated with IV normal saline. - Symptomatic therapy will be provided for pain and mucolytic's and antinausea medications.Marland Kitchen

## 2021-12-10 NOTE — ED Provider Notes (Signed)
[facility] Provider Note    Event Date/Time   First MD Initiated Contact with Patient 12/10/21 2106     (approximate)   History   Generalized Body Aches   HPI  Alexis [name] is a 46 y.o. female with no significant past medical history who presents with cough body aches and right-sided chest and abdominal pain.  Symptoms all started this morning.  [name] is diffuse body aches and pain on her right side involving the right chest and right abdomen.  She denies shortness of breath.  Pain does feel worse with breathing.  Denies urinary symptoms.  Has slight congestion and mild cough.  Eyes any history of anemia she has a regular monthly menstrual period bleeds about 3 days having to change her pad about 3 times per day.  Denies blood in her stool or hematuria.  Has never needed a blood transfusion never been told she was anemic.     No past medical history on file.  There are no problems to display for this patient.    Physical Exam  Triage Vital Signs: ED Triage Vitals  Enc Vitals Group     BP 12/10/21 1920 108/64     Pulse Rate 12/10/21 1920 (!) 101     Resp 12/10/21 1920 18     Temp 12/10/21 1920 99.2 F (37.3 C)     Temp Source 12/10/21 1920 Oral     SpO2 12/10/21 1920 100 %     Weight 12/10/21 1921 130 lb (59 kg)     Height 12/10/21 1921 5\' 7"  (1.702 m)     Head Circumference --      Peak Flow --      Pain Score 12/10/21 1921 6     Pain Loc --      Pain Edu? --      Excl. in GC? --     Most recent vital signs: Vitals:   12/10/21 1920 12/10/21 2057  BP: 108/64 (!) 99/56  Pulse: (!) 101 99  Resp: 18 18  Temp: 99.2 F (37.3 C) 100.1 F (37.8 C)  SpO2: 100% 100%     General: Pt looks like she feel unwell but is nontoxic  CV:  Good peripheral perfusion. No edema Resp:  Normal effort. Lungs are clear, no chest wall ttp Abd:  No distention. +RLQ ttp, no guarding Neuro:             Awake, Alert, Oriented x 3  Other:  Empty  rectal vault   ED Results / Procedures / Treatments  Labs (all labs ordered are listed, but only abnormal results are displayed) Labs Reviewed  RESP PANEL BY RT-PCR (FLU A&B, COVID) ARPGX2 - Abnormal; Notable for the following components:      Result Value   Influenza A by PCR POSITIVE (*)    All other components within normal limits  CBC WITH DIFFERENTIAL/PLATELET - Abnormal; Notable for the following components:   RBC 3.50 (*)    Hemoglobin 6.2 (*)    HCT 22.5 (*)    MCV 64.3 (*)    MCH 17.7 (*)    MCHC 27.6 (*)    RDW 24.8 (*)    Platelets 127 (*)    Lymphs Abs 0.1 (*)    All other components within normal limits  COMPREHENSIVE METABOLIC PANEL - Abnormal; Notable for the following components:   Sodium 132 (*)    Potassium 3.4 (*)    Glucose, Bld 123 (*)  AST 14 (*)    All other components within normal limits  URINALYSIS, ROUTINE W REFLEX MICROSCOPIC - Abnormal; Notable for the following components:   Color, Urine YELLOW (*)    APPearance CLOUDY (*)    Hgb urine dipstick MODERATE (*)    Ketones, ur 80 (*)    Protein, ur 30 (*)    Nitrite POSITIVE (*)    Bacteria, UA MANY (*)    All other components within normal limits  CULTURE, BLOOD (ROUTINE X 2)  PROTIME-INR  LACTIC ACID, PLASMA  LACTIC ACID, PLASMA  IRON AND TIBC  FERRITIN  POC URINE PREG, ED  TYPE AND SCREEN  PREPARE RBC (CROSSMATCH)  ABO/RH  TROPONIN I (HIGH SENSITIVITY)  TROPONIN I (HIGH SENSITIVITY)     EKG  EKG shows RBBB, NSR, no acute ischemic changes    RADIOLOGY  I reviewed and interpreted the CXR which does not show any acute cardiopulmonary process   PROCEDURES:  Critical Care performed: Yes, see critical care procedure note(s)  .Critical Care  Performed by: Georga Hacking, MD Authorized by: Georga Hacking, MD   Critical care provider statement:    Critical care time (minutes):  30   Critical care was time spent personally by me on the following activities:   Development of treatment plan with patient or surrogate, discussions with consultants, evaluation of patient's response to treatment, examination of patient, ordering and review of laboratory studies, ordering and review of radiographic studies, ordering and performing treatments and interventions, pulse oximetry, re-evaluation of patient's condition and review of old charts   The patient is on the cardiac monitor to evaluate for evidence of arrhythmia and/or significant heart rate changes.   MEDICATIONS ORDERED IN ED: Medications  0.9 %  sodium chloride infusion (has no administration in time range)  lactated ringers bolus 1,000 mL (has no administration in time range)  ketorolac (TORADOL) 15 MG/ML injection 15 mg (has no administration in time range)  iohexol (OMNIPAQUE) 300 MG/ML solution 100 mL (100 mLs Intravenous Contrast Given 12/10/21 2155)     IMPRESSION / MDM / ASSESSMENT AND PLAN / ED COURSE  I reviewed the triage vital signs and the nursing notes.                              Patient's presentation is most consistent with acute presentation with potential threat to life or bodily function.  Differential diagnosis includes, but is not limited to, viral illness, pneumonia, pleurisy  The patient is a 46 year old female who presents with cough body aches right-sided chest and abdominal pain x 1 day.  She has a low-grade fever here to 100.1 blood pressure is on the low side the rest of her vitals are reassuring.  She looks like she feels unwell but she is nontoxic.  Does look pale.  She complains of right chest wall and right abdominal pain as well as diffuse bodyaches cough she is not short of breath.  Denies urinary symptoms.  Patient is influenza A positive which I think explains her constellation of symptoms.  Chest x-ray is clear.  I did obtain a CT of the abdomen given the focal right lower quadrant tenderness and this is negative for acute abnormality there is a uterine fibroid  noted.  Patient's UA is nitrate positive but there is no leukocytes she has no urinary symptoms doubt UTI.  Patient's labs otherwise are notable for hemoglobin of 6.2.  We have  no prior CBCs in our system.  Patient tells me she has no history of anemia she never been told has never required a blood transfusion.  She gets a monthly menstrual period that last for just 3 days sounds like it is not heavy amount of bleeding.  Denies blood in her stool or black stool.  Did perform rectal exam she had empty rectal vault was unable to test stool for occult blood.  Her MCV is low with a uterine fibroid I suspect this is likely due to uterine bleeding but history does not really fit with this is much.  Given new anemia quiring blood transfusion will discuss with the hospitalist to admit the patient.  I have ordered iron studies and a unit of blood.       FINAL CLINICAL IMPRESSION(S) / ED DIAGNOSES   Final diagnoses:  Anemia, unspecified type  Influenza A     Rx / DC Orders   ED Discharge Orders     None        Note:  This document was prepared using Dragon voice recognition software and may include unintentional dictation errors.   Georga Hacking, MD 12/10/21 2234

## 2021-12-10 NOTE — Assessment & Plan Note (Signed)
-  We will replace potassium and check magnesium level. 

## 2021-12-10 NOTE — Assessment & Plan Note (Signed)
-   We will admit her to a medical telemetry bed. - We will obtain anemia work-up. - We will obtain stool Hemoccult since she did not have any stools with rectal exam. - She was typed and crossmatched and will be transfused 2 units of packed red blood cells. - We will follow posttransfusion H&H.

## 2021-12-10 NOTE — H&P (Signed)
[place]   PATIENT NAME: Alexis [name]    MR#:  [id number]  DATE OF BIRTH:  September 10, 1975  DATE OF ADMISSION:  12/10/2021  PRIMARY CARE PHYSICIAN: System, Provider Not In   Patient is coming from: Home  REQUESTING/REFERRING PHYSICIAN: Georga Hacking, MD  CHIEF COMPLAINT:   Chief Complaint  Patient presents with   Generalized Body Aches    HISTORY OF PRESENT ILLNESS:  Alexis [name] is a 46 y.o. female with no chronic medical problems, who presented to the emergency room with acute onset of generalized body aching with right-sided chest and abdominal pain, congested cough as well as tactile fever and chills.  She admitted to nausea without vomiting or diarrhea.  No melena or bright red bleeding per rectum.  She denies any vaginal bleeding or hematuria or other bleeding diathesis.  She denies any dysuria but has been having urinary frequency without flank pain.  She has been having mild headache without dizziness or blurred vision.  She usually has 3 days of menstrual period and has not been heavy.  ED Course: When she came to the ER, temperature was 99.2 and later 100.9 with heart rate of 101.  Hemoglobin was 6.2 and hematocrit 22.5 with no previous levels for comparison and platelets 127 with WBC 4.9.  RBC indices were low.  Sodium was 132 with potassium of 3.4 and glucose 123 with unremarkable CMP.  Influenza antigens showed positive influenza A and negative P with negative COVID-19 PCR.  UA came back positive for UTI EKG as reviewed by me : EKG showed normal sinus rhythm with a rate of 95 with right bundle branch block and T wave inversion anteriorly and inferiorly Imaging: Portable chest ray showed no acute cardiopulmonary disease.  Abdominal pelvic CT scan revealed a 1.1 cm subserosal uterine fibroid arising from the posterior uterine wall with no other acute abnormality.  The patient was typed and crossmatched and will be transfused 2 units of packed red blood cells.   She was given a gram of p.o. Tylenol and 1 L bolus of IV lactated Ringer.  She will be admitted to a medical telemetry bed for further evaluation and management. PAST MEDICAL HISTORY:  No past medical history on file.  None  PAST SURGICAL HISTORY:   Bilateral tubal ligation  SOCIAL HISTORY:   Social History   Tobacco Use   Smoking status: Never   Smokeless tobacco: Never  Substance Use Topics   Alcohol use: No    FAMILY HISTORY:  No family history on file.  DRUG ALLERGIES:  Positive for diabetes mellitus, hypertension, CVA and MI.  REVIEW OF SYSTEMS:   ROS As per history of present illness. All pertinent systems were reviewed above. Constitutional, HEENT, cardiovascular, respiratory, GI, GU, musculoskeletal, neuro, psychiatric, endocrine, integumentary and hematologic systems were reviewed and are otherwise negative/unremarkable except for positive findings mentioned above in the HPI.   MEDICATIONS AT HOME:   Prior to Admission medications   Medication Sig Start Date End Date Taking? Authorizing Provider  naproxen (NAPROSYN) 500 MG tablet Take 1 tablet (500 mg total) by mouth 2 (two) times daily with a meal. 05/30/20   Joni Reining, PA-C  ondansetron (ZOFRAN) 8 MG tablet Take 1 tablet (8 mg total) by mouth every 8 (eight) hours as needed for nausea or vomiting. 01/12/19   Sudie Grumbling, NP  orphenadrine (NORFLEX) 100 MG tablet Take 1 tablet (100 mg total) by mouth 2 (two) times daily. 05/30/20  Joni Reining, PA-C  oxyCODONE-acetaminophen (PERCOCET) 7.5-325 MG tablet Take 1 tablet by mouth every 6 (six) hours as needed. 05/30/20   Joni Reining, PA-C  albuterol (PROVENTIL HFA;VENTOLIN HFA) 108 (90 Base) MCG/ACT inhaler Inhale 2 puffs into the lungs every 4 (four) hours as needed for wheezing or shortness of breath. 12/28/17 01/12/19  Cuthriell, Delorise Royals, PA-C      VITAL SIGNS:  Blood pressure 117/60, pulse 90, temperature 99.9 F (37.7 C), temperature source Oral,  resp. rate 14, height 5\' 7"  (1.702 m), weight 59 kg, SpO2 100 %.  PHYSICAL EXAMINATION:  Physical Exam  GENERAL: Ill looking ST 46 y.o.-year-old African-American patient lying in the bed with no acute distress.  EYES: Pupils equal, round, reactive to light and accommodation. No scleral icterus.  Positive pallor extraocular muscles intact.  HEENT: Head atraumatic, normocephalic. Oropharynx and nasopharynx clear.  NECK:  Supple, no jugular venous distention. No thyroid enlargement, no tenderness.  LUNGS: Normal breath sounds bilaterally, no wheezing, rales,rhonchi or crepitation. No use of accessory muscles of respiration.  CARDIOVASCULAR: Regular rate and rhythm, S1, S2 normal. No murmurs, rubs, or gallops.  ABDOMEN: Soft, nondistended, nontender. Bowel sounds present. No organomegaly or mass.  EXTREMITIES: No pedal edema, cyanosis, or clubbing.  NEUROLOGIC: Cranial nerves II through XII are intact. Muscle strength 5/5 in all extremities. Sensation intact. Gait not checked.  PSYCHIATRIC: The patient is alert and oriented x 3.  Normal affect and good eye contact. SKIN: No obvious rash, lesion, or ulcer.   LABORATORY PANEL:   CBC Recent Labs  Lab 12/10/21 1924  WBC 4.9  HGB 6.2*  HCT 22.5*  PLT 127*   ------------------------------------------------------------------------------------------------------------------  Chemistries  Recent Labs  Lab 12/10/21 1924  NA 132*  K 3.4*  CL 102  CO2 24  GLUCOSE 123*  BUN 10  CREATININE 0.75  CALCIUM 8.9  AST 14*  ALT 9  ALKPHOS 60  BILITOT 1.0   ------------------------------------------------------------------------------------------------------------------  Cardiac Enzymes No results for input(s): "TROPONINI" in the last 168 hours. ------------------------------------------------------------------------------------------------------------------  RADIOLOGY:  CT ABDOMEN PELVIS W CONTRAST  Result Date: 12/10/2021 CLINICAL  DATA:  RLQ abdominal pain EXAM: CT ABDOMEN AND PELVIS WITH CONTRAST TECHNIQUE: Multidetector CT imaging of the abdomen and pelvis was performed using the standard protocol following bolus administration of intravenous contrast. RADIATION DOSE REDUCTION: This exam was performed according to the departmental dose-optimization program which includes automated exposure control, adjustment of the mA and/or kV according to patient size and/or use of iterative reconstruction technique. CONTRAST:  14/05/2021 OMNIPAQUE IOHEXOL 300 MG/ML  SOLN COMPARISON:  None Available. FINDINGS: Lower chest: No acute abnormality. Hepatobiliary: No focal liver abnormality. Contracted gallbladder. No gallstones, gallbladder wall thickening, or pericholecystic fluid. No biliary dilatation. Pancreas: No focal lesion. Normal pancreatic contour. No surrounding inflammatory changes. No main pancreatic ductal dilatation. Spleen: Normal in size without focal abnormality. Adrenals/Urinary Tract: No adrenal nodule bilaterally. Bilateral kidneys enhance symmetrically. Subcentimeter hypodensities too small to characterize-no further follow-up indicated. No hydronephrosis. No hydroureter. The urinary bladder is unremarkable. Stomach/Bowel: Stomach is within normal limits. No evidence of bowel wall thickening or dilatation. Appendix appears normal. Vascular/Lymphatic: No abdominal aorta or iliac aneurysm. No abdominal, pelvic, or inguinal lymphadenopathy. Reproductive: Likely 1.1 cm subserosal uterine fibroid arising from the posterior uterine wall. Otherwise uterus and bilateral adnexa are unremarkable. Other: No intraperitoneal free fluid. No intraperitoneal free gas. No organized fluid collection. Musculoskeletal: No abdominal wall hernia or abnormality. No suspicious lytic or blastic osseous lesions. No acute displaced fracture. Multilevel  degenerative changes of the spine. IMPRESSION: 1. No acute intra-abdominal or intrapelvic abnormality. 2. Likely 1.1  cm subserosal uterine fibroid arising from the posterior uterine wall. Electronically Signed   By: Tish Frederickson M.D.   On: 12/10/2021 22:19   DG Chest Portable 1 View  Result Date: 12/10/2021 CLINICAL DATA:  Chest pain. EXAM: PORTABLE CHEST 1 VIEW COMPARISON:  Chest radiograph dated 12/28/2017. FINDINGS: The heart size and mediastinal contours are within normal limits. Both lungs are clear. The visualized skeletal structures are unremarkable. IMPRESSION: No active disease. Electronically Signed   By: Elgie Collard M.D.   On: 12/10/2021 20:00      IMPRESSION AND PLAN:  Assessment and Plan: * Symptomatic anemia - We will admit her to a medical telemetry bed. - We will obtain anemia work-up. - We will obtain stool Hemoccult since she did not have any stools with rectal exam. - She was typed and crossmatched and will be transfused 2 units of packed red blood cells. - We will follow posttransfusion H&H.  Influenza A - We will place on p.o. Tamiflu. - She will be hydrated with IV normal saline. - Symptomatic therapy will be provided for pain and mucolytic's and antinausea medications..  Acute lower UTI - She may be meeting sepsis criteria given her fever and tachycardia however this could be related to her influenza A. - She will be hydrated with IV normal saline as mentioned above especially given her mild hyponatremia and will follow blood cultures. - We will place on IV Rocephin and follow urine culture.  Hypokalemia We will replace potassium and check magnesium level.    DVT prophylaxis: SCDs.  Medical prophylaxis is postponed to after stool Hemoccult. Advanced Care Planning:  Code Status: full code.  Family Communication:  The plan of care was discussed in details with the patient (and family). I answered all questions. The patient agreed to proceed with the above mentioned plan. Further management will depend upon hospital course. Disposition Plan: Back to previous home  environment Consults called: none.  All the records are reviewed and case discussed with ED provider.  Status is: Inpatient   At the time of the admission, it appears that the appropriate admission status for this patient is inpatient.  This is judged to be reasonable and necessary in order to provide the required intensity of service to ensure the patient's safety given the presenting symptoms, physical exam findings and initial radiographic and laboratory data in the context of comorbid conditions.  The patient requires inpatient status due to high intensity of service, high risk of further deterioration and high frequency of surveillance required.  I certify that at the time of admission, it is my clinical judgment that the patient will require inpatient hospital care extending more than 2 midnights.                            Dispo: The patient is from: Home              Anticipated d/c is to: Home              Patient currently is not medically stable to d/c.              Difficult to place patient: No  Hannah Beat M.D on 12/11/2021 at 12:00 AM  [facility]   From 7 PM-7 AM, contact night-coverage [email or web address]  CC: Primary care physician; System, Provider Not In

## 2021-12-10 NOTE — ED Provider Triage Note (Signed)
Emergency Medicine Provider Triage Evaluation Note  Alexis [name] , a 46 y.o. female  was evaluated in triage.  Pt complains of generalized body aches, chest pain, sweating, fever, cough since this morning.  Physical Exam  BP 108/64 (BP Location: Right Arm)   Pulse (!) 101   Temp 99.2 F (37.3 C) (Oral)   Resp 18   SpO2 100%  Gen:   Awake, no distress   Resp:  Normal effort  MSK:   Moves extremities without difficulty  Other:    Medical Decision Making  Medically screening exam initiated at 7:21 PM.  Appropriate orders placed.  Alexis [name] was informed that the remainder of the evaluation will be completed by another provider, this initial triage assessment does not replace that evaluation, and the importance of remaining in the ED until their evaluation is complete.    Alexis Pester, FNP 12/10/21 2234

## 2021-12-11 ENCOUNTER — Encounter: Payer: Self-pay | Admitting: Family Medicine

## 2021-12-11 ENCOUNTER — Other Ambulatory Visit: Payer: Self-pay

## 2021-12-11 LAB — RETICULOCYTES
Immature Retic Fract: 28.4 % — ABNORMAL HIGH (ref 2.3–15.9)
RBC.: 3.51 MIL/uL — ABNORMAL LOW (ref 3.87–5.11)
Retic Count, Absolute: 17.9 10*3/uL — ABNORMAL LOW (ref 19.0–186.0)
Retic Ct Pct: 0.5 % (ref 0.4–3.1)

## 2021-12-11 LAB — LACTIC ACID, PLASMA
Lactic Acid, Venous: 1.3 mmol/L (ref 0.5–1.9)
Lactic Acid, Venous: 0.9 mmol/L (ref 0.5–1.9)

## 2021-12-11 LAB — BASIC METABOLIC PANEL
Anion gap: 4 — ABNORMAL LOW (ref 5–15)
BUN: 10 mg/dL (ref 6–20)
CO2: 25 mmol/L (ref 22–32)
Calcium: 8.1 mg/dL — ABNORMAL LOW (ref 8.9–10.3)
Chloride: 107 mmol/L (ref 98–111)
Creatinine, Ser: 0.74 mg/dL (ref 0.44–1.00)
GFR, Estimated: >60 mL/min (ref >60)
Glucose, Bld: 96 mg/dL (ref 70–99)
Potassium: 2.9 mmol/L — ABNORMAL LOW (ref 3.5–5.1)
Sodium: 136 mmol/L (ref 135–145)

## 2021-12-11 LAB — VITAMIN D 25 HYDROXY (VIT D DEFICIENCY, FRACTURES): Vit D, 25-Hydroxy: 10.87 ng/mL — ABNORMAL LOW (ref 30–100)

## 2021-12-11 LAB — CBC
HCT: 24 % — ABNORMAL LOW (ref 36.0–46.0)
Hemoglobin: 6.9 g/dL — ABNORMAL LOW (ref 12.0–15.0)
MCH: 19.9 pg — ABNORMAL LOW (ref 26.0–34.0)
MCHC: 28.8 g/dL — ABNORMAL LOW (ref 30.0–36.0)
MCV: 69.2 fL — ABNORMAL LOW (ref 80.0–100.0)
Platelets: 95 10*3/uL — ABNORMAL LOW (ref 150–400)
RBC: 3.47 MIL/uL — ABNORMAL LOW (ref 3.87–5.11)
RDW: 28 % — ABNORMAL HIGH (ref 11.5–15.5)
WBC: 3.1 10*3/uL — ABNORMAL LOW (ref 4.0–10.5)
nRBC: 0 % (ref 0.0–0.2)

## 2021-12-11 LAB — HEMOGLOBIN AND HEMATOCRIT, BLOOD
HCT: 24.3 % — ABNORMAL LOW (ref 36.0–46.0)
HCT: 32.9 % — ABNORMAL LOW (ref 36.0–46.0)
Hemoglobin: 7.1 g/dL — ABNORMAL LOW (ref 12.0–15.0)
Hemoglobin: 9.8 g/dL — ABNORMAL LOW (ref 12.0–15.0)

## 2021-12-11 LAB — PROTIME-INR
INR: 1.3 — ABNORMAL HIGH (ref 0.8–1.2)
Prothrombin Time: 15.9 seconds — ABNORMAL HIGH (ref 11.4–15.2)

## 2021-12-11 LAB — TYPE AND SCREEN

## 2021-12-11 LAB — PHOSPHORUS: Phosphorus: 3.2 mg/dL (ref 2.5–4.6)

## 2021-12-11 LAB — PREPARE RBC (CROSSMATCH)

## 2021-12-11 LAB — FOLATE: Folate: 10.1 ng/mL (ref >5.9)

## 2021-12-11 LAB — VITAMIN B12: Vitamin B-12: 224 pg/mL (ref 180–914)

## 2021-12-11 LAB — HIV ANTIBODY (ROUTINE TESTING W REFLEX): HIV Screen 4th Generation wRfx: NONREACTIVE

## 2021-12-11 LAB — LACTATE DEHYDROGENASE: LDH: 100 U/L (ref 98–192)

## 2021-12-11 LAB — MAGNESIUM: Magnesium: 1.8 mg/dL (ref 1.7–2.4)

## 2021-12-11 LAB — APTT: aPTT: 33 seconds (ref 24–36)

## 2021-12-11 MED ORDER — POTASSIUM CHLORIDE CRYS ER 20 MEQ PO TBCR
40.0000 meq | EXTENDED_RELEASE_TABLET | Freq: Once | ORAL | Status: AC
  Administered 2021-12-11: 40 meq via ORAL
  Filled 2021-12-11: qty 2

## 2021-12-11 MED ORDER — SODIUM CHLORIDE 0.9% IV SOLUTION
Freq: Once | INTRAVENOUS | Status: AC
  Filled 2021-12-11: qty 250

## 2021-12-11 MED ORDER — SODIUM CHLORIDE 0.9 % IV SOLN
2.0000 g | INTRAVENOUS | Status: DC
  Administered 2021-12-11: 2 g via INTRAVENOUS
  Filled 2021-12-11: qty 20

## 2021-12-11 MED ORDER — POTASSIUM CHLORIDE 10 MEQ/100ML IV SOLN
10.0000 meq | INTRAVENOUS | Status: AC
  Administered 2021-12-11 (×4): 10 meq via INTRAVENOUS
  Filled 2021-12-11 (×4): qty 100

## 2021-12-11 MED ORDER — POLYSACCHARIDE IRON COMPLEX 150 MG PO CAPS
150.0000 mg | ORAL_CAPSULE | Freq: Every day | ORAL | Status: DC
  Administered 2021-12-12 – 2021-12-13 (×2): 150 mg via ORAL
  Filled 2021-12-11 (×3): qty 1

## 2021-12-11 MED ORDER — VITAMIN C 500 MG PO TABS
500.0000 mg | ORAL_TABLET | Freq: Every day | ORAL | Status: DC
  Administered 2021-12-11 – 2021-12-13 (×3): 500 mg via ORAL
  Filled 2021-12-11 (×3): qty 1

## 2021-12-11 NOTE — Progress Notes (Signed)
[facility] Progress Note  Patient: Alexis [name]    GYJ:[id number]  DOA: 12/10/2021     Date of Service: the patient was seen and examined on 12/11/2021  Chief Complaint  Patient presents with   Generalized Body Aches   Brief hospital course:  Alexis [name] is a 46 y.o. female with no chronic medical problems, who presented to the emergency room with acute onset of generalized body aching with right-sided chest and abdominal pain, congested cough as well as tactile fever and chills.  She admitted to nausea without vomiting or diarrhea.  No melena or bright red bleeding per rectum.  She denies any vaginal bleeding or hematuria or other bleeding diathesis.  She denies any dysuria but has been having urinary frequency without flank pain.  She has been having mild headache without dizziness or blurred vision.  She usually has 3 days of menstrual period and has not been heavy.   ED Course: When she came to the ER, temperature was 99.2 and later 100.9 with heart rate of 101.  Hemoglobin was 6.2 and hematocrit 22.5 with no previous levels for comparison and platelets 127 with WBC 4.9.  RBC indices were low.  Sodium was 132 with potassium of 3.4 and glucose 123 with unremarkable CMP.  Influenza antigens showed positive influenza A and negative P with negative COVID-19 PCR.  UA came back positive for UTI EKG as reviewed by me : EKG showed normal sinus rhythm with a rate of 95 with right bundle branch block and T wave inversion anteriorly and inferiorly Imaging: Portable chest ray showed no acute cardiopulmonary disease.  Abdominal pelvic CT scan revealed a 1.1 cm subserosal uterine fibroid arising from the posterior uterine wall with no other acute abnormality.   The patient was typed and crossmatched and will be transfused 2 units of packed red blood cells.  She was given a gram of p.o. Tylenol and 1 L bolus of IV lactated Ringer.  She will be admitted to a medical telemetry bed for further  evaluation and management.    Assessment and Plan:  Symptomatic anemia Iron deficiency, transferrin saturation 5% FOBT pending S/p transfusion of 2 units of packed red blood cells. - We will follow posttransfusion H&H. Hb 6.9, patient was transfused another unit of PRBC Started oral iron supplements with vitamin C Folate within normal range  Influenza A - We will place on p.o. Tamiflu. - She will be hydrated with IV normal saline. - Symptomatic therapy will be provided for pain and mucolytic's and antinausea medications..   Acute lower UTI No sepsis, patient had a fever and tachycardia due to influenza and anemia S/p IV fluid given for hydration S/p ceftriaxone which has been discontinued for now Follow urine culture Mild hyponatremia, resolved after IV fluid    Hypokalemia We will replace potassium and check magnesium level.    Body mass index is 20.36 kg/m.  Interventions:        Diet: Heart healthy diet DVT Prophylaxis: SCD, pharmacological prophylaxis contraindicated due to symptomatic anemia    Advance goals of care discussion: Full code  Family Communication: family was not present at bedside, at the time of interview.  The pt provided permission to discuss medical plan with the family. Opportunity was given to ask question and all questions were answered satisfactorily.   Disposition:  Pt is from Home, admitted with fever, flu, hypokalemia and anemia, still has hypokalemia, which precludes a safe discharge. Discharge to Home, when stable, may require 1-2  more days.  Subjective: No significant overnight events, patient still has mild cough, chest pain or palpitation, no shortness of breath.   Physical Exam: General:  alert oriented to time, place, and person.  Appear in no distress, affect appropriate Eyes: PERRLA ENT: Oral Mucosa Clear, moist  Neck: no JVD,  Cardiovascular: S1 and S2 Present, no Murmur,  Respiratory: good respiratory effort,  Bilateral Air entry equal and Decreased, no Crackles, no wheezes Abdomen: Bowel Sound present, Soft and no tenderness,  Skin: no rashes Extremities: no Pedal edema, no calf tenderness Neurologic: without any new focal findings Gait not checked due to patient safety concerns  Vitals:   12/11/21 1050 12/11/21 1603 12/11/21 1700 12/11/21 1725  BP: 113/70 128/71 130/73   Pulse: 77 83 79 86  Resp: 14 16 18 15   Temp: 97.9 F (36.6 C) 97.9 F (36.6 C)    TempSrc: Temporal Temporal    SpO2: 100% 99%    Weight:      Height:        Intake/Output Summary (Last 24 hours) at 12/11/2021 1741 Last data filed at 12/11/2021 1050 Gross per 24 hour  Intake 750 ml  Output --  Net 750 ml   Filed Weights   12/10/21 1921  Weight: 59 kg    Data Reviewed: I have personally reviewed and interpreted daily labs, tele strips, imagings as discussed above. I reviewed all nursing notes, pharmacy notes, vitals, pertinent old records I have discussed plan of care as described above with RN and patient/family.  CBC: Recent Labs  Lab 12/10/21 1924 12/11/21 0036 12/11/21 0625  WBC 4.9  --  3.1*  NEUTROABS 4.3  --   --   HGB 6.2* 7.1* 6.9*  HCT 22.5* 24.3* 24.0*  MCV 64.3*  --  69.2*  PLT 127*  --  95*   Basic Metabolic Panel: Recent Labs  Lab 12/10/21 1924 12/11/21 0036 12/11/21 0625  NA 132*  --  136  K 3.4*  --  2.9*  CL 102  --  107  CO2 24  --  25  GLUCOSE 123*  --  96  BUN 10  --  10  CREATININE 0.75  --  0.74  CALCIUM 8.9  --  8.1*  MG  --  1.8  --   PHOS  --  3.2  --     Studies: CT ABDOMEN PELVIS W CONTRAST  Result Date: 12/10/2021 CLINICAL DATA:  RLQ abdominal pain EXAM: CT ABDOMEN AND PELVIS WITH CONTRAST TECHNIQUE: Multidetector CT imaging of the abdomen and pelvis was performed using the standard protocol following bolus administration of intravenous contrast. RADIATION DOSE REDUCTION: This exam was performed according to the departmental dose-optimization program which  includes automated exposure control, adjustment of the mA and/or kV according to patient size and/or use of iterative reconstruction technique. CONTRAST:  14/05/2021 OMNIPAQUE IOHEXOL 300 MG/ML  SOLN COMPARISON:  None Available. FINDINGS: Lower chest: No acute abnormality. Hepatobiliary: No focal liver abnormality. Contracted gallbladder. No gallstones, gallbladder wall thickening, or pericholecystic fluid. No biliary dilatation. Pancreas: No focal lesion. Normal pancreatic contour. No surrounding inflammatory changes. No main pancreatic ductal dilatation. Spleen: Normal in size without focal abnormality. Adrenals/Urinary Tract: No adrenal nodule bilaterally. Bilateral kidneys enhance symmetrically. Subcentimeter hypodensities too small to characterize-no further follow-up indicated. No hydronephrosis. No hydroureter. The urinary bladder is unremarkable. Stomach/Bowel: Stomach is within normal limits. No evidence of bowel wall thickening or dilatation. Appendix appears normal. Vascular/Lymphatic: No abdominal aorta or iliac aneurysm. No abdominal, pelvic,  or inguinal lymphadenopathy. Reproductive: Likely 1.1 cm subserosal uterine fibroid arising from the posterior uterine wall. Otherwise uterus and bilateral adnexa are unremarkable. Other: No intraperitoneal free fluid. No intraperitoneal free gas. No organized fluid collection. Musculoskeletal: No abdominal wall hernia or abnormality. No suspicious lytic or blastic osseous lesions. No acute displaced fracture. Multilevel degenerative changes of the spine. IMPRESSION: 1. No acute intra-abdominal or intrapelvic abnormality. 2. Likely 1.1 cm subserosal uterine fibroid arising from the posterior uterine wall. Electronically Signed   By: Tish Frederickson M.D.   On: 12/10/2021 22:19   DG Chest Portable 1 View  Result Date: 12/10/2021 CLINICAL DATA:  Chest pain. EXAM: PORTABLE CHEST 1 VIEW COMPARISON:  Chest radiograph dated 12/28/2017. FINDINGS: The heart size and  mediastinal contours are within normal limits. Both lungs are clear. The visualized skeletal structures are unremarkable. IMPRESSION: No active disease. Electronically Signed   By: Elgie Collard M.D.   On: 12/10/2021 20:00    Scheduled Meds:  vitamin C  500 mg Oral Daily   iron polysaccharides  150 mg Oral Daily   oseltamivir  75 mg Oral BID   pantoprazole  40 mg Oral Daily   Continuous Infusions:  sodium chloride     sodium chloride 100 mL/hr at 12/11/21 1256   sodium chloride Stopped (12/10/21 2356)   PRN Meds: acetaminophen **OR** acetaminophen, magnesium hydroxide, ondansetron **OR** ondansetron (ZOFRAN) IV, traZODone  Time spent: 35 minutes  Author: Gillis Santa. MD [facility] 12/11/2021 5:41 PM  To reach On-call, see care teams to locate the attending and reach out to them via [email or web address]. If 7PM-7AM, please contact night-coverage If you still have difficulty reaching the attending provider, please page the [facility] (Director on Call) for [facility] on amion for assistance.

## 2021-12-11 NOTE — Assessment & Plan Note (Addendum)
-   She may be meeting sepsis criteria given her fever and tachycardia however this could be related to her influenza A. - She will be hydrated with IV normal saline as mentioned above especially given her mild hyponatremia and will follow blood cultures. - We will place on IV Rocephin and follow urine culture.

## 2021-12-11 NOTE — ED Notes (Signed)
Per Bishop Limbo, NP: Ok to hold second ordered unit of blood.

## 2021-12-12 LAB — BASIC METABOLIC PANEL
Anion gap: 6 (ref 5–15)
BUN: 7 mg/dL (ref 6–20)
CO2: 21 mmol/L — ABNORMAL LOW (ref 22–32)
Calcium: 8 mg/dL — ABNORMAL LOW (ref 8.9–10.3)
Chloride: 107 mmol/L (ref 98–111)
Creatinine, Ser: 0.66 mg/dL (ref 0.44–1.00)
GFR, Estimated: >60 mL/min (ref >60)
Glucose, Bld: 101 mg/dL — ABNORMAL HIGH (ref 70–99)
Potassium: 3.7 mmol/L (ref 3.5–5.1)
Sodium: 134 mmol/L — ABNORMAL LOW (ref 135–145)

## 2021-12-12 LAB — MAGNESIUM: Magnesium: 1.8 mg/dL (ref 1.7–2.4)

## 2021-12-12 LAB — CBC
HCT: 28.3 % — ABNORMAL LOW (ref 36.0–46.0)
Hemoglobin: 8.6 g/dL — ABNORMAL LOW (ref 12.0–15.0)
MCH: 21.4 pg — ABNORMAL LOW (ref 26.0–34.0)
MCHC: 30.4 g/dL (ref 30.0–36.0)
MCV: 70.4 fL — ABNORMAL LOW (ref 80.0–100.0)
Platelets: 84 10*3/uL — ABNORMAL LOW (ref 150–400)
RBC: 4.02 MIL/uL (ref 3.87–5.11)
RDW: 26.5 % — ABNORMAL HIGH (ref 11.5–15.5)
WBC: 4.4 10*3/uL (ref 4.0–10.5)
nRBC: 0 % (ref 0.0–0.2)

## 2021-12-12 LAB — TYPE AND SCREEN
Antibody Screen: NEGATIVE
Unit division: 0
Unit division: 0

## 2021-12-12 LAB — BPAM RBC
Blood Product Expiration Date: 202401012359
Blood Product Expiration Date: 202401052359
ISSUE DATE / TIME: 202312052240
ISSUE DATE / TIME: 202312060823
Unit Type and Rh: 5100
Unit Type and Rh: 5100

## 2021-12-12 LAB — URINE CULTURE: Culture: NO GROWTH

## 2021-12-12 LAB — PHOSPHORUS: Phosphorus: 1.6 mg/dL — ABNORMAL LOW (ref 2.5–4.6)

## 2021-12-12 MED ORDER — BISACODYL 10 MG RE SUPP: 10.0000 mg | Freq: Every day | RECTAL | Status: DC | PRN

## 2021-12-12 MED ORDER — CYANOCOBALAMIN 1000 MCG/ML IJ SOLN
1000.0000 ug | Freq: Every day | INTRAMUSCULAR | Status: DC
  Administered 2021-12-12 – 2021-12-13 (×2): 1000 ug via INTRAMUSCULAR
  Filled 2021-12-12 (×2): qty 1

## 2021-12-12 MED ORDER — POTASSIUM PHOSPHATES 15 MMOLE/5ML IV SOLN
30.0000 mmol | Freq: Once | INTRAVENOUS | Status: AC
  Administered 2021-12-12: 30 mmol via INTRAVENOUS
  Filled 2021-12-12: qty 10

## 2021-12-12 MED ORDER — VITAMIN D (ERGOCALCIFEROL) 1.25 MG (50000 UNIT) PO CAPS
50000.0000 [IU] | ORAL_CAPSULE | ORAL | Status: DC
  Administered 2021-12-12: 50000 [IU] via ORAL
  Filled 2021-12-12: qty 1

## 2021-12-12 MED ORDER — VITAMIN B-12 1000 MCG PO TABS: 1000.0000 ug | ORAL_TABLET | Freq: Every day | ORAL | Status: DC

## 2021-12-12 MED ORDER — BISACODYL 5 MG PO TBEC: 10.0000 mg | DELAYED_RELEASE_TABLET | Freq: Every day | ORAL | Status: DC | PRN

## 2021-12-12 NOTE — Progress Notes (Signed)
[facility] Progress Note  Patient: Alexis [name]    ZOX:[id number]  DOA: 12/10/2021     Date of Service: the patient was seen and examined on 12/12/2021  Chief Complaint  Patient presents with   Generalized Body Aches   Brief hospital course:  Alexis [name] is a 46 y.o. female with no chronic medical problems, who presented to the emergency room with acute onset of generalized body aching with right-sided chest and abdominal pain, congested cough as well as tactile fever and chills.  She admitted to nausea without vomiting or diarrhea.  No melena or bright red bleeding per rectum.  She denies any vaginal bleeding or hematuria or other bleeding diathesis.  She denies any dysuria but has been having urinary frequency without flank pain.  She has been having mild headache without dizziness or blurred vision.  She usually has 3 days of menstrual period and has not been heavy.   ED Course: When she came to the ER, temperature was 99.2 and later 100.9 with heart rate of 101.  Hemoglobin was 6.2 and hematocrit 22.5 with no previous levels for comparison and platelets 127 with WBC 4.9.  RBC indices were low.  Sodium was 132 with potassium of 3.4 and glucose 123 with unremarkable CMP.  Influenza antigens showed positive influenza A and negative P with negative COVID-19 PCR.  UA came back positive for UTI EKG as reviewed by me : EKG showed normal sinus rhythm with a rate of 95 with right bundle branch block and T wave inversion anteriorly and inferiorly Imaging: Portable chest ray showed no acute cardiopulmonary disease.  Abdominal pelvic CT scan revealed a 1.1 cm subserosal uterine fibroid arising from the posterior uterine wall with no other acute abnormality.   The patient was typed and crossmatched and will be transfused 2 units of packed red blood cells.  She was given a gram of p.o. Tylenol and 1 L bolus of IV lactated Ringer.  She will be admitted to a medical telemetry bed for further  evaluation and management.    Assessment and Plan:  Symptomatic anemia Iron deficiency, transferrin saturation 5% FOBT pending S/p transfusion of 2 units of packed red blood cells. - We will follow posttransfusion H&H. Hb 6.9, patient was transfused another unit of PRBC Started oral iron supplements with vitamin C Folate within normal range Hb 8.6, remained stable. Hematology consulted for further management Vitamin B12 level 224, target >400, started vitamin B12 1000 mcg IM injection during hospital stay followed by oral supplement.  Follow with PCP to repeat a vitamin B12 level after 3 to 6 months.    Thrombocytopenia, platelet count decreased to 84 today    Influenza A - Continue p.o. Tamiflu. - Continue gentle hydration  - Symptomatic therapy will be provided for pain and mucolytic's and antinausea medications..   Acute lower UTI No sepsis, patient had a fever and tachycardia due to influenza and anemia S/p IV fluid given for hydration S/p ceftriaxone which has been discontinued for now Follow urine culture Mild hyponatremia, resolved after IV fluid    Hypokalemia, resolved We will replace potassium and check magnesium level.    Hypophosphatemia, phosphorus repleted. Monitor and replete as needed.  Vitamin D deficiency, started vitamin D 50,000 units p.o. weekly.  Follow with PCP to repeat vitamin D level after 3 to 6 months.   Body mass index is 20.36 kg/m.  Interventions:        Diet: Heart healthy diet DVT Prophylaxis: SCD, pharmacological  prophylaxis contraindicated due to symptomatic anemia    Advance goals of care discussion: Full code  Family Communication: family was not present at bedside, at the time of interview.  The pt provided permission to discuss medical plan with the family. Opportunity was given to ask question and all questions were answered satisfactorily.   Disposition:  Pt is from Home, admitted with fever, flu, hypokalemia and  anemia, still has hypophosphatemia and thrombocytopenia, hematology consulted, which precludes a safe discharge. Discharge to Home, when stable, may require 1-2 more days.  Subjective: No significant overnight events, patient still has mild cough, chest pain or palpitation, no shortness of breath.   Physical Exam: General:  alert oriented to time, place, and person.  Appear in no distress, affect appropriate Eyes: PERRLA ENT: Oral Mucosa Clear, moist  Neck: no JVD,  Cardiovascular: S1 and S2 Present, no Murmur,  Respiratory: good respiratory effort, Bilateral Air entry equal and Decreased, no Crackles, no wheezes Abdomen: Bowel Sound present, Soft and no tenderness,  Skin: no rashes Extremities: no Pedal edema, no calf tenderness Neurologic: without any new focal findings Gait not checked due to patient safety concerns  Vitals:   12/11/21 1725 12/11/21 1753 12/11/21 2325 12/12/21 0820  BP:  135/78 (!) 147/60 124/74  Pulse: 86 83 79 70  Resp: 15 16 20 17   Temp:  99.4 F (37.4 C) 98.7 F (37.1 C) 98.1 F (36.7 C)  TempSrc:    Axillary  SpO2:  100% 98% 99%  Weight:      Height:        Intake/Output Summary (Last 24 hours) at 12/12/2021 1452 Last data filed at 12/12/2021 0500 Gross per 24 hour  Intake 1792.78 ml  Output --  Net 1792.78 ml   Filed Weights   12/10/21 1921  Weight: 59 kg    Data Reviewed: I have personally reviewed and interpreted daily labs, tele strips, imagings as discussed above. I reviewed all nursing notes, pharmacy notes, vitals, pertinent old records I have discussed plan of care as described above with RN and patient/family.  CBC: Recent Labs  Lab 12/10/21 1924 12/11/21 0036 12/11/21 0625 12/11/21 1854 12/12/21 0221  WBC 4.9  --  3.1*  --  4.4  NEUTROABS 4.3  --   --   --   --   HGB 6.2* 7.1* 6.9* 9.8* 8.6*  HCT 22.5* 24.3* 24.0* 32.9* 28.3*  MCV 64.3*  --  69.2*  --  70.4*  PLT 127*  --  95*  --  84*   Basic Metabolic Panel: Recent  Labs  Lab 12/10/21 1924 12/11/21 0036 12/11/21 0625 12/12/21 0221  NA 132*  --  136 134*  K 3.4*  --  2.9* 3.7  CL 102  --  107 107  CO2 24  --  25 21*  GLUCOSE 123*  --  96 101*  BUN 10  --  10 7  CREATININE 0.75  --  0.74 0.66  CALCIUM 8.9  --  8.1* 8.0*  MG  --  1.8  --  1.8  PHOS  --  3.2  --  1.6*    Studies: No results found.  Scheduled Meds:  vitamin C  500 mg Oral Daily   cyanocobalamin  1,000 mcg Intramuscular Q1200   Followed by   14/07/23 ON 12/19/2021] vitamin B-12  1,000 mcg Oral Daily   iron polysaccharides  150 mg Oral Daily   oseltamivir  75 mg Oral BID   pantoprazole  40 mg  Oral Daily   Vitamin D (Ergocalciferol)  50,000 Units Oral Q7 days   Continuous Infusions:  sodium chloride     sodium chloride 50 mL/hr at 12/11/21 1750   sodium chloride Stopped (12/10/21 2356)   potassium PHOSPHATE IVPB (in mmol) 30 mmol (12/12/21 1040)   PRN Meds: acetaminophen **OR** acetaminophen, bisacodyl, bisacodyl, magnesium hydroxide, ondansetron **OR** ondansetron (ZOFRAN) IV, traZODone  Time spent: 35 minutes  Author: Val Riles. MD [facility] 12/12/2021 2:52 PM  To reach On-call, see care teams to locate the attending and reach out to them via [email or web address]. If 7PM-7AM, please contact night-coverage If you still have difficulty reaching the attending provider, please page the [facility] (Director on Call) for [facility] on amion for assistance.

## 2021-12-12 NOTE — TOC Initial Note (Signed)
Transition of Care [facility]) - Initial/Assessment Note    Patient Details  Name: Alexis [name] MRN: [id number] Date of Birth: January 26, 1975  Transition of Care [facility]) CM/SW Contact:    Marlowe Sax, RN Phone Number: 12/12/2021, 2:09 PM  Clinical Narrative:                   Transition of Care (TOC) Screening Note   Patient Details  Name: Alexis [name] Date of Birth: 1975/05/12   Transition of [facility]) CM/SW Contact:    Marlowe Sax, RN Phone Number: 12/12/2021, 2:09 PM    Transition of Care Department [facility]) has reviewed patient and no TOC needs have been identified at this time. We will continue to monitor patient advancement through interdisciplinary progression rounds. If new patient transition needs arise, please place a TOC consult.         Patient Goals and CMS Choice        Expected Discharge Plan and Services                                                Prior Living Arrangements/Services                       Activities of Daily Living Home Assistive Devices/Equipment: None ADL Screening (condition at time of admission) Patient's cognitive ability adequate to safely complete daily activities?: Yes Is the patient deaf or have difficulty hearing?: No Does the patient have difficulty seeing, even when wearing glasses/contacts?: No Does the patient have difficulty concentrating, remembering, or making decisions?: No Patient able to express need for assistance with ADLs?: Yes Does the patient have difficulty dressing or bathing?: No Independently performs ADLs?: Yes (appropriate for developmental age) Does the patient have difficulty walking or climbing stairs?: No Weakness of Legs: None Weakness of Arms/Hands: None  Permission Sought/Granted                  Emotional Assessment              Admission diagnosis:  Influenza A [J10.1] Symptomatic anemia [D64.9] Anemia, unspecified type [D64.9] Patient  Active Problem List   Diagnosis Date Noted   Symptomatic anemia 12/10/2021   Influenza A 12/10/2021   Hypokalemia 12/10/2021   Acute lower UTI 12/10/2021   PCP:  System, Provider Not In Pharmacy:   [facility] [address] [place] - [address] [place] [address] Phone: [phone] Fax: [phone]  [facility] [address] [place], [place] - [address] [place] [address] Phone: [phone] Fax: [phone]     Social Determinants of Health (SDOH) Interventions    Readmission Risk Interventions     No data to display

## 2021-12-12 NOTE — Consult Note (Signed)
[facility]  Telephone:([phone]) [phone] [phone]  ID: Alexis [name] OB: 1975/12/23  MR#: [id number]  OFB#:[id number]  Patient Care Team: System, Provider Not In as PCP - General  CHIEF COMPLAINT: Anemia and thrombocytopenia.  INTERVAL HISTORY: Patient is a 46 year old female who was recently admitted to the hospital for influenza and found to have a significantly reduced hemoglobin of 6.2.  Her platelet count on admission is 127 and has now declined to 84.  She feels improved since admission.  She has no neurologic complaints.  She does not complain of any weakness or fatigue.  She has good appetite and denies weight loss.  She has no chest pain, shortness of breath, cough, or hemoptysis.  She denies any nausea, vomiting, constipation, or diarrhea.  She has no melena or hematochezia.  She has no urinary complaints.  Patient offers no further specific complaints today.  REVIEW OF SYSTEMS:   Review of Systems  Constitutional:  Positive for fever. Negative for malaise/fatigue and weight loss.  Respiratory:  Positive for cough. Negative for hemoptysis and shortness of breath.   Cardiovascular: Negative.  Negative for chest pain and leg swelling.  Gastrointestinal: Negative.  Negative for abdominal pain, blood in stool and melena.  Genitourinary: Negative.  Negative for dysuria and hematuria.  Musculoskeletal: Negative.  Negative for back pain.  Skin: Negative.  Negative for rash.  Neurological: Negative.  Negative for dizziness, focal weakness, weakness and headaches.  Psychiatric/Behavioral: Negative.  The patient is not nervous/anxious.     As per HPI. Otherwise, a complete review of systems is negative.  PAST MEDICAL HISTORY: History reviewed. No pertinent past medical history.  PAST SURGICAL HISTORY: Past Surgical History:  Procedure Laterality Date   NO PAST SURGERIES      FAMILY HISTORY: History reviewed. No pertinent family history.  ADVANCED  DIRECTIVES (Y/N):  @ADVDIR @  HEALTH MAINTENANCE: Social History   Tobacco Use   Smoking status: Never   Smokeless tobacco: Never  Vaping Use   Vaping Use: Never used  Substance Use Topics   Alcohol use: No   Drug use: Never     Colonoscopy:  PAP:  Bone density:  Lipid panel:  No Known Allergies  Current Facility-Administered Medications  Medication Dose Route Frequency Provider Last Rate Last Admin   0.9 %  sodium chloride infusion  10 mL/hr Intravenous Once , MD       0.9 %  sodium chloride infusion   Intravenous Continuous Georga Hacking, MD 50 mL/hr at 12/11/21 1750 Rate Change at 12/11/21 1750   0.9 %  sodium chloride infusion  10 mL/hr Intravenous Once 14/06/23, MD   Stopping previously hung infusion at 12/10/21 2356   acetaminophen (TYLENOL) tablet 650 mg  650 mg Oral Q6H PRN Mansy, Jan A, MD       Or   acetaminophen (TYLENOL) suppository 650 mg  650 mg Rectal Q6H PRN Mansy, Jan A, MD       ascorbic acid (VITAMIN C) tablet 500 mg  500 mg Oral Daily Feb, MD   500 mg at 12/12/21 1035   bisacodyl (DULCOLAX) EC tablet 10 mg  10 mg Oral Daily PRN 14/07/23, MD       bisacodyl (DULCOLAX) suppository 10 mg  10 mg Rectal Daily PRN Gillis Santa, MD       cyanocobalamin (VITAMIN B12) injection 1,000 mcg  1,000 mcg Intramuscular Q1200 Gillis Santa, MD   1,000 mcg at 12/12/21 1000   Followed  by   Melene Muller ON 12/19/2021] cyanocobalamin (VITAMIN B12) tablet 1,000 mcg  1,000 mcg Oral Daily Gillis Santa, MD       iron polysaccharides (NIFEREX) capsule 150 mg  150 mg Oral Daily Gillis Santa, MD   150 mg at 12/12/21 1035   magnesium hydroxide (MILK OF MAGNESIA) suspension 30 mL  30 mL Oral Daily PRN Mansy, Jan A, MD       ondansetron [facility]) tablet 4 mg  4 mg Oral Q6H PRN Mansy, Jan A, MD       Or   ondansetron [facility]) injection 4 mg  4 mg Intravenous Q6H PRN Mansy, Jan A, MD       oseltamivir (TAMIFLU) capsule 75 mg  75 mg Oral BID Mansy, Jan  A, MD   75 mg at 12/12/21 1035   pantoprazole (PROTONIX) EC tablet 40 mg  40 mg Oral Daily Mansy, Jan A, MD   40 mg at 12/12/21 1035   potassium PHOSPHATE 30 mmol in dextrose 5 % 500 mL infusion  30 mmol Intravenous Once Gillis Santa, MD 85 mL/hr at 12/12/21 1040 30 mmol at 12/12/21 1040   traZODone (DESYREL) tablet 25 mg  25 mg Oral QHS PRN Mansy, Jan A, MD       Vitamin D (Ergocalciferol) (DRISDOL) 1.25 MG (50000 UNIT) capsule 50,000 Units  50,000 Units Oral Q7 days Gillis Santa, MD   50,000 Units at 12/12/21 1035    OBJECTIVE: Vitals:   12/12/21 0820 12/12/21 1602  BP: 124/74 111/70  Pulse: 70 71  Resp: 17 18  Temp: 98.1 F (36.7 C)   SpO2: 99% 100%     Body mass index is 20.36 kg/m.    ECOG FS:0 - Asymptomatic  General: Well-developed, well-nourished, no acute distress. Eyes: Pink conjunctiva, anicteric sclera. HEENT: Normocephalic, moist mucous membranes. Lungs: No audible wheezing or coughing. Heart: Regular rate and rhythm. Abdomen: Soft, nontender, no obvious distention. Musculoskeletal: No edema, cyanosis, or clubbing. Neuro: Alert, answering all questions appropriately. Cranial nerves grossly intact. Skin: No rashes or petechiae noted. Psych: Normal affect. Lymphatics: No cervical, calvicular, axillary or inguinal LAD.   LAB RESULTS:  Lab Results  Component Value Date   NA 134 (L) 12/12/2021   K 3.7 12/12/2021   CL 107 12/12/2021   CO2 21 (L) 12/12/2021   GLUCOSE 101 (H) 12/12/2021   BUN 7 12/12/2021   CREATININE 0.66 12/12/2021   CALCIUM 8.0 (L) 12/12/2021   PROT 7.9 12/10/2021   ALBUMIN 4.0 12/10/2021   AST 14 (L) 12/10/2021   ALT 9 12/10/2021   ALKPHOS 60 12/10/2021   BILITOT 1.0 12/10/2021   GFRNONAA >60 12/12/2021    Lab Results  Component Value Date   WBC 4.4 12/12/2021   NEUTROABS 4.3 12/10/2021   HGB 8.6 (L) 12/12/2021   HCT 28.3 (L) 12/12/2021   MCV 70.4 (L) 12/12/2021   PLT 84 (L) 12/12/2021     STUDIES: CT ABDOMEN PELVIS W  CONTRAST  Result Date: 12/10/2021 CLINICAL DATA:  RLQ abdominal pain EXAM: CT ABDOMEN AND PELVIS WITH CONTRAST TECHNIQUE: Multidetector CT imaging of the abdomen and pelvis was performed using the standard protocol following bolus administration of intravenous contrast. RADIATION DOSE REDUCTION: This exam was performed according to the departmental dose-optimization program which includes automated exposure control, adjustment of the mA and/or kV according to patient size and/or use of iterative reconstruction technique. CONTRAST:  OMNIPAQUE IOHEXOL 300 MG/ML  SOLN COMPARISON:  None Available. FINDINGS: Lower chest: No acute abnormality. Hepatobiliary:  No focal liver abnormality. Contracted gallbladder. No gallstones, gallbladder wall thickening, or pericholecystic fluid. No biliary dilatation. Pancreas: No focal lesion. Normal pancreatic contour. No surrounding inflammatory changes. No main pancreatic ductal dilatation. Spleen: Normal in size without focal abnormality. Adrenals/Urinary Tract: No adrenal nodule bilaterally. Bilateral kidneys enhance symmetrically. Subcentimeter hypodensities too small to characterize-no further follow-up indicated. No hydronephrosis. No hydroureter. The urinary bladder is unremarkable. Stomach/Bowel: Stomach is within normal limits. No evidence of bowel wall thickening or dilatation. Appendix appears normal. Vascular/Lymphatic: No abdominal aorta or iliac aneurysm. No abdominal, pelvic, or inguinal lymphadenopathy. Reproductive: Likely 1.1 cm subserosal uterine fibroid arising from the posterior uterine wall. Otherwise uterus and bilateral adnexa are unremarkable. Other: No intraperitoneal free fluid. No intraperitoneal free gas. No organized fluid collection. Musculoskeletal: No abdominal wall hernia or abnormality. No suspicious lytic or blastic osseous lesions. No acute displaced fracture. Multilevel degenerative changes of the spine. IMPRESSION: 1. No acute  intra-abdominal or intrapelvic abnormality. 2. Likely 1.1 cm subserosal uterine fibroid arising from the posterior uterine wall. Electronically Signed   By: Tish Frederickson M.D.   On: 12/10/2021 22:19   DG Chest Portable 1 View  Result Date: 12/10/2021 CLINICAL DATA:  Chest pain. EXAM: PORTABLE CHEST 1 VIEW COMPARISON:  Chest radiograph dated 12/28/2017. FINDINGS: The heart size and mediastinal contours are within normal limits. Both lungs are clear. The visualized skeletal structures are unremarkable. IMPRESSION: No active disease. Electronically Signed   By: Elgie Collard M.D.   On: 12/10/2021 20:00    ASSESSMENT: Anemia and thrombocytopenia.  PLAN:    Iron deficiency anemia: Suspect this is chronic and longstanding given the fact the patient was asymptomatic until she was diagnosed with influenza.  Likely related to ongoing menses.  Patient's hemoglobin initially improved to 9.8 with transfusion and now has trended back slightly to 8.6.  She has significantly reduced iron stores.  No intervention is needed.  Maintain hemoglobin greater than 7.0 while in the hospital.  Will arrange follow-up in the cancer center in 1 week for repeat laboratory work and consideration of IV iron for possible transfusion. Thrombocytopenia: Likely multifactorial given ongoing menses as well as delusional with 2 units of packed red blood cells given.  Continue to monitor.  If platelets do not increase as outpatient, can complete full workup then. Influenza: Patient currently on Tamiflu.  Symptomatically she feels better.  Appreciate consult, will follow.   Jeralyn Ruths, MD   12/12/2021 4:26 PM

## 2021-12-13 DIAGNOSIS — D649 Anemia, unspecified: Secondary | ICD-10-CM | POA: Diagnosis not present

## 2021-12-13 LAB — CBC
HCT: 31.6 % — ABNORMAL LOW (ref 36.0–46.0)
Hemoglobin: 9.4 g/dL — ABNORMAL LOW (ref 12.0–15.0)
MCH: 20.9 pg — ABNORMAL LOW (ref 26.0–34.0)
MCHC: 29.7 g/dL — ABNORMAL LOW (ref 30.0–36.0)
MCV: 70.4 fL — ABNORMAL LOW (ref 80.0–100.0)
Platelets: 93 10*3/uL — ABNORMAL LOW (ref 150–400)
RBC: 4.49 MIL/uL (ref 3.87–5.11)
RDW: 27.9 % — ABNORMAL HIGH (ref 11.5–15.5)
WBC: 2.7 10*3/uL — ABNORMAL LOW (ref 4.0–10.5)
nRBC: 0 % (ref 0.0–0.2)

## 2021-12-13 LAB — MAGNESIUM: Magnesium: 2 mg/dL (ref 1.7–2.4)

## 2021-12-13 LAB — BASIC METABOLIC PANEL
Anion gap: 4 — ABNORMAL LOW (ref 5–15)
BUN: 9 mg/dL (ref 6–20)
CO2: 26 mmol/L (ref 22–32)
Calcium: 8.4 mg/dL — ABNORMAL LOW (ref 8.9–10.3)
Chloride: 107 mmol/L (ref 98–111)
Creatinine, Ser: 0.62 mg/dL (ref 0.44–1.00)
GFR, Estimated: >60 mL/min (ref >60)
Glucose, Bld: 85 mg/dL (ref 70–99)
Potassium: 3.5 mmol/L (ref 3.5–5.1)
Sodium: 137 mmol/L (ref 135–145)

## 2021-12-13 LAB — PHOSPHORUS: Phosphorus: 3.9 mg/dL (ref 2.5–4.6)

## 2021-12-13 MED ORDER — OSELTAMIVIR PHOSPHATE 75 MG PO CAPS: 75.0000 mg | ORAL_CAPSULE | Freq: Two times a day (BID) | ORAL | 0 refills | Status: AC

## 2021-12-13 MED ORDER — ASCORBIC ACID 500 MG PO TABS: 500.0000 mg | ORAL_TABLET | Freq: Every day | ORAL | 0 refills | Status: AC

## 2021-12-13 MED ORDER — CYANOCOBALAMIN 1000 MCG PO TABS: 1000.0000 ug | ORAL_TABLET | Freq: Every day | ORAL | 0 refills | Status: AC

## 2021-12-13 MED ORDER — VITAMIN D (ERGOCALCIFEROL) 1.25 MG (50000 UNIT) PO CAPS: 50000.0000 [IU] | ORAL_CAPSULE | ORAL | 0 refills | Status: AC

## 2021-12-13 MED ORDER — POLYSACCHARIDE IRON COMPLEX 150 MG PO CAPS: 150.0000 mg | ORAL_CAPSULE | Freq: Every day | ORAL | 0 refills | Status: AC

## 2021-12-13 NOTE — Discharge Summary (Signed)
[facility] Discharge Summary   Patient: Alexis [name] [id number]  PCP: System, Provider Not In  Date of admission: 12/10/2021   Date of discharge:  12/13/2021     Discharge Diagnoses:  Principal Problem:   Symptomatic anemia Active Problems:   Influenza A   Acute lower UTI   Hypokalemia   Admitted From: Home Disposition:  Home   Recommendations for Outpatient Follow-up:  PCP: in 1 wk,  Follow-up with hematologist in 1 to 2 weeks, repeat CBC and further management of iron deficiency as an outpatient Follow up LABS/TEST:  repeat iron profile, vitamin B12 and vitamin D level after 3 to 6 months.   Diet recommendation: Regular diet  Activity: The patient is advised to gradually reintroduce usual activities, as tolerated  Discharge Condition: stable  Code Status: Full code   History of present illness: As per the H and P dictated on admission Hospital Course:  Alexis [name] is a 46 y.o. female with no chronic medical problems, who presented to the emergency room with acute onset of generalized body aching with right-sided chest and abdominal pain, congested cough as well as tactile fever and chills.  She admitted to nausea without vomiting or diarrhea.  No melena or bright red bleeding per rectum.  She denies any vaginal bleeding or hematuria or other bleeding diathesis.  She denies any dysuria but has been having urinary frequency without flank pain.  She has been having mild headache without dizziness or blurred vision. She usually has 3 days of menstrual period and has not been heavy. ED Course: When she came to the ER, temperature was 99.2 and later 100.9 with heart rate of 101. Hemoglobin was 6.2 and hematocrit 22.5 with no previous levels for comparison and platelets 127 with WBC 4.9.  RBC indices were low.  Sodium was 132 with potassium of 3.4 and glucose 123 with unremarkable CMP.  Influenza antigens showed positive influenza A and negative P with negative  COVID-19 PCR.  UA came back positive for UTI EKG as reviewed by me : EKG showed normal sinus rhythm with a rate of 95 with right bundle branch block and T wave inversion anteriorly and inferiorly Imaging: Portable chest ray showed no acute cardiopulmonary disease.  Abdominal pelvic CT scan revealed a 1.1 cm subserosal uterine fibroid arising from the posterior uterine wall with no other acute abnormality. The patient was typed and crossmatched and will be transfused 2 units of packed red blood cells.  She was given a gram of p.o. Tylenol and 1 L bolus of IV lactated Ringer.  She will be admitted to a medical telemetry bed for further evaluation and management.   Assessment and Plan: Symptomatic anemia, Iron deficiency, transferrin saturation 5% FOBT was ordered but never collected.  Recommend to follow with PCP to do FOBT as an outpatient if persistent anemia. S/p transfusion of 2 units of packed red blood cells. Hb 6.9, patient was transfused another unit of PRBC. Started oral iron supplements with vitamin C Folate within normal range. Hb 9.4, remained stable. Hematology consulted for further management Vitamin B12 level 224, target >400, started vitamin B12 1000 mcg IM injection during hospital stay followed by oral supplement.  Follow with PCP to repeat a vitamin B12 level after 3 to 6 months. # Thrombocytopenia, platelet count decreased to 84--93, remained stable could be due to viral infection.  Hematology was consulted, recommended to follow as an outpatient, repeat CBC after 1 week.  # Influenza A, s/p Tamiflu p.o.  given during hospital stay, prescribed for 2 additional days.  To complete 5-day course.  Patient was given gentle hydration during hospital stay and symptomatic treatment.  Currently patient is stable. # Acute lower UTI, No sepsis, patient had a fever and tachycardia due to influenza and anemia S/p IV fluid given for hydration. S/p ceftriaxone which was discontinued, urine culture  negative.  Blood culture negative. # Mild hyponatremia, resolved after IV fluid # Hypokalemia, potassium repleted and resolved # Hypophosphatemia, phosphorus repleted. Resolved  # Vitamin D deficiency, started vitamin D 50,000 units p.o. weekly.  Follow with PCP to repeat vitamin D level after 3 to 6 months. Body mass index is 20.36 kg/m.  Interventions:  Patient was ambulatory without any assistance. On the day of the discharge the patient's vitals were stable, and no other acute medical condition were reported by patient. the patient was felt safe to be discharge at Home.  Consultants: None Procedures: None  Discharge Exam: General: Appear in no distress, no Rash; Oral Mucosa Clear, moist. Cardiovascular: S1 and S2 Present, no Murmur, Respiratory: normal respiratory effort, Bilateral Air entry present and no Crackles, no wheezes Abdomen: Bowel Sound present, Soft and no tenderness, no hernia Extremities: no Pedal edema, no calf tenderness Neurology: alert and oriented to time, place, and person affect appropriate.  Filed Weights   12/10/21 1921  Weight: 59 kg   Vitals:   12/13/21 0038 12/13/21 0907  BP: 112/70 108/67  Pulse: 68 67  Resp: 18 16  Temp: 98.5 F (36.9 C) 98.1 F (36.7 C)  SpO2: 99% 100%    DISCHARGE MEDICATION: Allergies as of 12/13/2021   No Known Allergies      Medication List     STOP taking these medications    naproxen 500 MG tablet Commonly known as: Naprosyn   ondansetron 8 MG tablet Commonly known as: Zofran   orphenadrine 100 MG tablet Commonly known as: NORFLEX   oxyCODONE-acetaminophen 7.5-325 MG tablet Commonly known as: Percocet       TAKE these medications    ascorbic acid 500 MG tablet Commonly known as: VITAMIN C Take 1 tablet (500 mg total) by mouth daily. Start taking on: December 14, 2021   cyanocobalamin 1000 MCG tablet Take 1 tablet (1,000 mcg total) by mouth daily. Start taking on: December 19, 2021   iron  polysaccharides 150 MG capsule Commonly known as: NIFEREX Take 1 capsule (150 mg total) by mouth daily. Start taking on: December 14, 2021   oseltamivir 75 MG capsule Commonly known as: TAMIFLU Take 1 capsule (75 mg total) by mouth 2 (two) times daily for 2 days.   Vitamin D (Ergocalciferol) 1.25 MG (50000 UNIT) Caps capsule Commonly known as: DRISDOL Take 1 capsule (50,000 Units total) by mouth every 7 (seven) days. Start taking on: December 19, 2021       No Known Allergies Discharge Instructions     Call MD for:  difficulty breathing, headache or visual disturbances   Complete by: As directed    Call MD for:  extreme fatigue   Complete by: As directed    Call MD for:  persistant dizziness or light-headedness   Complete by: As directed    Call MD for:  persistant nausea and vomiting   Complete by: As directed    Call MD for:  severe uncontrolled pain   Complete by: As directed    Call MD for:  temperature >100.4   Complete by: As directed    Diet - low  sodium heart healthy   Complete by: As directed    Discharge instructions   Complete by: As directed    Follow-up with PCP in 1 week, repeat iron profile, vitamin B12 and vitamin D level after 3 to 6 months. Follow-up with hematologist in 1 to 2 weeks, repeat CBC and further management of iron deficiency as an outpatient.   Increase activity slowly   Complete by: As directed        The results of significant diagnostics from this hospitalization (including imaging, microbiology, ancillary and laboratory) are listed below for reference.    Significant Diagnostic Studies: CT ABDOMEN PELVIS W CONTRAST  Result Date: 12/10/2021 CLINICAL DATA:  RLQ abdominal pain EXAM: CT ABDOMEN AND PELVIS WITH CONTRAST TECHNIQUE: Multidetector CT imaging of the abdomen and pelvis was performed using the standard protocol following bolus administration of intravenous contrast. RADIATION DOSE REDUCTION: This exam was performed according to  the departmental dose-optimization program which includes automated exposure control, adjustment of the mA and/or kV according to patient size and/or use of iterative reconstruction technique. CONTRAST:  OMNIPAQUE IOHEXOL 300 MG/ML  SOLN COMPARISON:  None Available. FINDINGS: Lower chest: No acute abnormality. Hepatobiliary: No focal liver abnormality. Contracted gallbladder. No gallstones, gallbladder wall thickening, or pericholecystic fluid. No biliary dilatation. Pancreas: No focal lesion. Normal pancreatic contour. No surrounding inflammatory changes. No main pancreatic ductal dilatation. Spleen: Normal in size without focal abnormality. Adrenals/Urinary Tract: No adrenal nodule bilaterally. Bilateral kidneys enhance symmetrically. Subcentimeter hypodensities too small to characterize-no further follow-up indicated. No hydronephrosis. No hydroureter. The urinary bladder is unremarkable. Stomach/Bowel: Stomach is within normal limits. No evidence of bowel wall thickening or dilatation. Appendix appears normal. Vascular/Lymphatic: No abdominal aorta or iliac aneurysm. No abdominal, pelvic, or inguinal lymphadenopathy. Reproductive: Likely 1.1 cm subserosal uterine fibroid arising from the posterior uterine wall. Otherwise uterus and bilateral adnexa are unremarkable. Other: No intraperitoneal free fluid. No intraperitoneal free gas. No organized fluid collection. Musculoskeletal: No abdominal wall hernia or abnormality. No suspicious lytic or blastic osseous lesions. No acute displaced fracture. Multilevel degenerative changes of the spine. IMPRESSION: 1. No acute intra-abdominal or intrapelvic abnormality. 2. Likely 1.1 cm subserosal uterine fibroid arising from the posterior uterine wall. Electronically Signed   By: Tish Frederickson M.D.   On: 12/10/2021 22:19   DG Chest Portable 1 View  Result Date: 12/10/2021 CLINICAL DATA:  Chest pain. EXAM: PORTABLE CHEST 1 VIEW COMPARISON:  Chest radiograph dated  12/28/2017. FINDINGS: The heart size and mediastinal contours are within normal limits. Both lungs are clear. The visualized skeletal structures are unremarkable. IMPRESSION: No active disease. Electronically Signed   By: Elgie Collard M.D.   On: 12/10/2021 20:00    Microbiology: Recent Results (from the past 240 hour(s))  Resp Panel by RT-PCR (Flu A&B, Covid) Anterior Nasal Swab     Status: Abnormal   Collection Time: 12/10/21  7:24 PM   Specimen: Anterior Nasal Swab  Result Value Ref Range Status   SARS Coronavirus 2 by RT PCR NEGATIVE NEGATIVE Final    Comment: (NOTE) SARS-CoV-2 target nucleic acids are NOT DETECTED.  The SARS-CoV-2 RNA is generally detectable in upper respiratory specimens during the acute phase of infection. The lowest concentration of SARS-CoV-2 viral copies this assay can detect is 138 copies/mL. A negative result does not preclude SARS-Cov-2 infection and should not be used as the sole basis for treatment or other patient management decisions. A negative result may occur with  improper specimen collection/handling, submission of specimen other  than nasopharyngeal swab, presence of viral mutation(s) within the areas targeted by this assay, and inadequate number of viral copies(<138 copies/mL). A negative result must be combined with clinical observations, patient history, and epidemiological information. The expected result is Negative.  Fact Sheet for Patients:  EntrepreneurPulse.com.au  Fact Sheet for Healthcare Providers:  IncredibleEmployment.be  This test is no t yet approved or cleared by the [place] FDA and  has been authorized for detection and/or diagnosis of SARS-CoV-2 by FDA under an Emergency Use Authorization (EUA). This EUA will remain  in effect (meaning this test can be used) for the duration of the COVID-19 declaration under Section 564(b)(1) of the Act, 21 U.S.C.section 360bbb-3(b)(1), unless the  authorization is terminated  or revoked sooner.       Influenza A by PCR POSITIVE (A) NEGATIVE Final   Influenza B by PCR NEGATIVE NEGATIVE Final    Comment: (NOTE) The Xpert Xpress SARS-CoV-2/FLU/RSV plus assay is intended as an aid in the diagnosis of influenza from Nasopharyngeal swab specimens and should not be used as a sole basis for treatment. Nasal washings and aspirates are unacceptable for Xpert Xpress SARS-CoV-2/FLU/RSV testing.  Fact Sheet for Patients: EntrepreneurPulse.com.au  Fact Sheet for Healthcare Providers: IncredibleEmployment.be  This test is not yet approved or cleared by the [place] FDA and has been authorized for detection and/or diagnosis of SARS-CoV-2 by FDA under an Emergency Use Authorization (EUA). This EUA will remain in effect (meaning this test can be used) for the duration of the COVID-19 declaration under Section 564(b)(1) of the Act, 21 U.S.C. section 360bbb-3(b)(1), unless the authorization is terminated or revoked.  Performed at [facility], [address] [place], [place] [address]   Blood culture (routine x 2)     Status: None (Preliminary result)   Collection Time: 12/10/21  9:02 PM   Specimen: BLOOD  Result Value Ref Range Status   Specimen Description BLOOD LEFT ANTECUBITAL  Final   Special Requests Blood Culture adequate volume  Final   Culture   Final    NO GROWTH 3 DAYS Performed at [facility], [address] [place], [place] [address]    Report Status PENDING  Incomplete  Urine Culture     Status: None   Collection Time: 12/11/21  5:27 PM   Specimen: Urine, Clean Catch  Result Value Ref Range Status   Specimen Description   Final    URINE, CLEAN CATCH Performed at [facility], [address], [place], [place] [address]    Special Requests   Final    NONE Performed at [facility], [address] [place], [place] [address]     Culture   Final    NO GROWTH Performed at [facility], [place] [address], [place], [place] [address]    Report Status 12/12/2021 FINAL  Final  Culture, blood (x 2)     Status: None (Preliminary result)   Collection Time: 12/12/21  2:21 AM   Specimen: BLOOD  Result Value Ref Range Status   Specimen Description BLOOD LEFT HAND  Final   Special Requests   Final    BOTTLES DRAWN AEROBIC AND ANAEROBIC Blood Culture adequate volume   Culture   Final    NO GROWTH 1 DAY Performed at [facility], [address], [place],  [address]    Report Status PENDING  Incomplete  Culture, blood (x 2)     Status: None (Preliminary result)   Collection Time: 12/12/21  2:21 AM   Specimen: BLOOD  Result Value Ref Range Status   Specimen Description BLOOD RIGHT HAND  Final   Special Requests   Final    BOTTLES DRAWN AEROBIC AND ANAEROBIC Blood Culture adequate volume   Culture   Final    NO GROWTH 1 DAY Performed at [facility], [address] [place], [place] [address]    Report Status PENDING  Incomplete     Labs: CBC: Recent Labs  Lab 12/10/21 1924 12/11/21 0036 12/11/21 0625 12/11/21 1854 12/12/21 0221 12/13/21 0608  WBC 4.9  --  3.1*  --  4.4 2.7*  NEUTROABS 4.3  --   --   --   --   --   HGB 6.2* 7.1* 6.9* 9.8* 8.6* 9.4*  HCT 22.5* 24.3* 24.0* 32.9* 28.3* 31.6*  MCV 64.3*  --  69.2*  --  70.4* 70.4*  PLT 127*  --  95*  --  84* 93*   Basic Metabolic Panel: Recent Labs  Lab 12/10/21 1924 12/11/21 0036 12/11/21 0625 12/12/21 0221 12/13/21 0608  NA 132*  --  136 134* 137  K 3.4*  --  2.9* 3.7 3.5  CL 102  --  107 107 107  CO2 24  --  25 21* 26  GLUCOSE 123*  --  96 101* 85  BUN 10  --  10 7 9   CREATININE 0.75  --  0.74 0.66 0.62  CALCIUM 8.9  --  8.1* 8.0* 8.4*  MG  --  1.8  --  1.8 2.0  PHOS  --  3.2  --  1.6* 3.9   Liver Function Tests: Recent Labs  Lab 12/10/21 1924  AST 14*  ALT 9  ALKPHOS 60  BILITOT 1.0  PROT 7.9  ALBUMIN 4.0    No results for input(s): "LIPASE", "AMYLASE" in the last 168 hours. No results for input(s): "AMMONIA" in the last 168 hours. Cardiac Enzymes: No results for input(s): "CKTOTAL", "CKMB", "CKMBINDEX", "TROPONINI" in the last 168 hours. BNP (last 3 results) No results for input(s): "BNP" in the last 8760 hours. CBG: No results for input(s): "GLUCAP" in the last 168 hours.  Time spent: 35 minutes  Signed:  Val Riles  [facility]  12/13/2021 11:16 AM

## 2021-12-14 LAB — HAPTOGLOBIN: Haptoglobin: 193 mg/dL (ref 42–296)

## 2021-12-15 LAB — CULTURE, BLOOD (ROUTINE X 2)
Culture: NO GROWTH
Special Requests: ADEQUATE

## 2021-12-18 ENCOUNTER — Other Ambulatory Visit: Payer: Self-pay | Admitting: Oncology

## 2021-12-18 DIAGNOSIS — D509 Iron deficiency anemia, unspecified: Secondary | ICD-10-CM | POA: Insufficient documentation

## 2021-12-18 LAB — CULTURE, BLOOD (ROUTINE X 2)
Culture: NO GROWTH
Culture: NO GROWTH
Special Requests: ADEQUATE
Special Requests: ADEQUATE

## 2021-12-20 ENCOUNTER — Other Ambulatory Visit: Payer: Self-pay | Admitting: *Deleted

## 2021-12-20 DIAGNOSIS — D509 Iron deficiency anemia, unspecified: Secondary | ICD-10-CM

## 2021-12-20 MED FILL — Iron Sucrose Inj 20 MG/ML (Fe Equiv): INTRAVENOUS | Qty: 10 | Status: AC

## 2021-12-23 ENCOUNTER — Encounter: Payer: Self-pay | Admitting: Oncology

## 2021-12-23 ENCOUNTER — Inpatient Hospital Stay: Payer: BC Managed Care – PPO | Attending: Oncology

## 2021-12-23 ENCOUNTER — Inpatient Hospital Stay (HOSPITAL_BASED_OUTPATIENT_CLINIC_OR_DEPARTMENT_OTHER): Payer: BC Managed Care – PPO | Admitting: Oncology

## 2021-12-23 ENCOUNTER — Inpatient Hospital Stay: Payer: BC Managed Care – PPO

## 2021-12-23 VITALS — BP 106/55 | HR 88 | Temp 96.4°F | Resp 16 | Ht 67.0 in | Wt 128.0 lb

## 2021-12-23 DIAGNOSIS — D509 Iron deficiency anemia, unspecified: Secondary | ICD-10-CM

## 2021-12-23 DIAGNOSIS — D75839 Thrombocytosis, unspecified: Secondary | ICD-10-CM | POA: Insufficient documentation

## 2021-12-23 DIAGNOSIS — D696 Thrombocytopenia, unspecified: Secondary | ICD-10-CM | POA: Insufficient documentation

## 2021-12-23 LAB — CBC WITH DIFFERENTIAL/PLATELET
Abs Immature Granulocytes: 0.03 10*3/uL (ref 0.00–0.07)
Basophils Absolute: 0.1 10*3/uL (ref 0.0–0.1)
Basophils Relative: 1 %
Eosinophils Absolute: 0.1 10*3/uL (ref 0.0–0.5)
Eosinophils Relative: 1 %
HCT: 31.8 % — ABNORMAL LOW (ref 36.0–46.0)
Hemoglobin: 9.5 g/dL — ABNORMAL LOW (ref 12.0–15.0)
Immature Granulocytes: 1 %
Lymphocytes Relative: 14 %
Lymphs Abs: 0.9 10*3/uL (ref 0.7–4.0)
MCH: 21.3 pg — ABNORMAL LOW (ref 26.0–34.0)
MCHC: 29.9 g/dL — ABNORMAL LOW (ref 30.0–36.0)
MCV: 71.1 fL — ABNORMAL LOW (ref 80.0–100.0)
Monocytes Absolute: 0.6 10*3/uL (ref 0.1–1.0)
Monocytes Relative: 9 %
Neutro Abs: 4.9 10*3/uL (ref 1.7–7.7)
Neutrophils Relative %: 74 %
Platelets: 918 10*3/uL (ref 150–400)
RBC: 4.47 MIL/uL (ref 3.87–5.11)
RDW: 28.7 % — ABNORMAL HIGH (ref 11.5–15.5)
Smear Review: INCREASED
WBC: 6.5 10*3/uL (ref 4.0–10.5)
nRBC: 0 % (ref 0.0–0.2)

## 2021-12-23 LAB — PATHOLOGIST SMEAR REVIEW

## 2021-12-23 LAB — SAMPLE TO BLOOD BANK

## 2021-12-23 MED ORDER — SODIUM CHLORIDE 0.9 % IV SOLN
200.0000 mg | Freq: Once | INTRAVENOUS | Status: AC
  Administered 2021-12-23: 200 mg via INTRAVENOUS
  Filled 2021-12-23: qty 200

## 2021-12-23 MED ORDER — SODIUM CHLORIDE 0.9 % IV SOLN
Freq: Once | INTRAVENOUS | Status: AC
  Filled 2021-12-23: qty 250

## 2021-12-23 NOTE — Patient Instructions (Signed)

## 2021-12-23 NOTE — Progress Notes (Signed)
[place]  [phone] [phone]) [phone]  ID: Alexis [name] OB: [birth year]  MR#: [id number]  [id number]  Patient Care Team: System, Provider Not In as PCP - General  CHIEF COMPLAINT: Iron deficiency anemia.  INTERVAL HISTORY: Patient is a 46 year old female who was initially evaluated as a inpatient hospital consult.  She was admitted for influenza and found to have a significantly reduced hemoglobin, iron stores, and platelet count.  She received 2 units of packed red blood cells as well as 2 treatments of IV iron while in the hospital.  She currently feels improved and nearly back to her baseline.  She has no neurologic complaints.  She denies any recent fevers or illnesses.  She has a good appetite and denies weight loss.  She has no chest pain, shortness of breath, cough, or hemoptysis.  She denies any nausea, vomiting, constipation, or diarrhea.  She has no melena or hematochezia.  She has no urinary complaints.  Patient feels at her baseline and offers no specific complaints today.  REVIEW OF SYSTEMS:   Review of Systems  Constitutional: Negative.  Negative for fever, malaise/fatigue and weight loss.  Respiratory: Negative.  Negative for cough, hemoptysis and shortness of breath.   Cardiovascular: Negative.  Negative for chest pain and leg swelling.  Gastrointestinal: Negative.  Negative for abdominal pain, blood in stool and melena.  Genitourinary: Negative.  Negative for dysuria.  Musculoskeletal: Negative.  Negative for back pain.  Skin: Negative.  Negative for rash.  Neurological: Negative.  Negative for dizziness, focal weakness, weakness and headaches.  Psychiatric/Behavioral:  The patient is not nervous/anxious.     As per HPI. Otherwise, a complete review of systems is negative.  PAST MEDICAL HISTORY: History reviewed. No pertinent past medical history.  PAST SURGICAL HISTORY: Past Surgical History:  Procedure Laterality Date    NO PAST SURGERIES      FAMILY HISTORY: History reviewed. No pertinent family history.  ADVANCED DIRECTIVES (Y/N):  N  HEALTH MAINTENANCE: Social History   Tobacco Use   Smoking status: Never   Smokeless tobacco: Never  Vaping Use   Vaping Use: Never used  Substance Use Topics   Alcohol use: No   Drug use: Never     Colonoscopy:  PAP:  Bone density:  Lipid panel:  No Known Allergies  Current Outpatient Medications  Medication Sig Dispense Refill   ascorbic acid (VITAMIN C) 500 MG tablet Take 1 tablet (500 mg total) by mouth daily. 90 tablet 0   cyanocobalamin 1000 MCG tablet Take 1 tablet (1,000 mcg total) by mouth daily. 90 tablet 0   iron polysaccharides (NIFEREX) 150 MG capsule Take 1 capsule (150 mg total) by mouth daily. 90 capsule 0   Vitamin D, Ergocalciferol, (DRISDOL) 1.25 MG (50000 UNIT) CAPS capsule Take 1 capsule (50,000 Units total) by mouth every 7 (seven) days. 12 capsule 0   No current facility-administered medications for this visit.    OBJECTIVE: Vitals:   12/23/21 1059  BP: (!) 106/55  Pulse: 88  Resp: 16  Temp: (!) 96.4 F (35.8 C)  SpO2: 100%     Body mass index is 20.05 kg/m.    ECOG FS:0 - Asymptomatic  General: Well-developed, well-nourished, no acute distress. Eyes: Pink conjunctiva, anicteric sclera. HEENT: Normocephalic, moist mucous membranes. Lungs: No audible wheezing or coughing. Heart: Regular rate and rhythm. Abdomen: Soft, nontender, no obvious distention. Musculoskeletal: No edema, cyanosis, or clubbing. Neuro: Alert, answering all questions appropriately. Cranial nerves grossly intact. Skin: No  rashes or petechiae noted. Psych: Normal affect. Lymphatics: No cervical, calvicular, axillary or inguinal LAD.   LAB RESULTS:  Lab Results  Component Value Date   NA 137 12/13/2021   K 3.5 12/13/2021   CL 107 12/13/2021   CO2 26 12/13/2021   GLUCOSE 85 12/13/2021   BUN 9 12/13/2021   CREATININE 0.62 12/13/2021    CALCIUM 8.4 (L) 12/13/2021   PROT 7.9 12/10/2021   ALBUMIN 4.0 12/10/2021   AST 14 (L) 12/10/2021   ALT 9 12/10/2021   ALKPHOS 60 12/10/2021   BILITOT 1.0 12/10/2021   GFRNONAA >60 12/13/2021    Lab Results  Component Value Date   WBC 6.5 12/23/2021   NEUTROABS 4.9 12/23/2021   HGB 9.5 (L) 12/23/2021   HCT 31.8 (L) 12/23/2021   MCV 71.1 (L) 12/23/2021   PLT 918 (HH) 12/23/2021     STUDIES: CT ABDOMEN PELVIS W CONTRAST  Result Date: 12/10/2021 CLINICAL DATA:  RLQ abdominal pain EXAM: CT ABDOMEN AND PELVIS WITH CONTRAST TECHNIQUE: Multidetector CT imaging of the abdomen and pelvis was performed using the standard protocol following bolus administration of intravenous contrast. RADIATION DOSE REDUCTION: This exam was performed according to the departmental dose-optimization program which includes automated exposure control, adjustment of the mA and/or kV according to patient size and/or use of iterative reconstruction technique. CONTRAST:  OMNIPAQUE IOHEXOL 300 MG/ML  SOLN COMPARISON:  None Available. FINDINGS: Lower chest: No acute abnormality. Hepatobiliary: No focal liver abnormality. Contracted gallbladder. No gallstones, gallbladder wall thickening, or pericholecystic fluid. No biliary dilatation. Pancreas: No focal lesion. Normal pancreatic contour. No surrounding inflammatory changes. No main pancreatic ductal dilatation. Spleen: Normal in size without focal abnormality. Adrenals/Urinary Tract: No adrenal nodule bilaterally. Bilateral kidneys enhance symmetrically. Subcentimeter hypodensities too small to characterize-no further follow-up indicated. No hydronephrosis. No hydroureter. The urinary bladder is unremarkable. Stomach/Bowel: Stomach is within normal limits. No evidence of bowel wall thickening or dilatation. Appendix appears normal. Vascular/Lymphatic: No abdominal aorta or iliac aneurysm. No abdominal, pelvic, or inguinal lymphadenopathy. Reproductive: Likely 1.1 cm  subserosal uterine fibroid arising from the posterior uterine wall. Otherwise uterus and bilateral adnexa are unremarkable. Other: No intraperitoneal free fluid. No intraperitoneal free gas. No organized fluid collection. Musculoskeletal: No abdominal wall hernia or abnormality. No suspicious lytic or blastic osseous lesions. No acute displaced fracture. Multilevel degenerative changes of the spine. IMPRESSION: 1. No acute intra-abdominal or intrapelvic abnormality. 2. Likely 1.1 cm subserosal uterine fibroid arising from the posterior uterine wall. Electronically Signed   By: Tish Frederickson M.D.   On: 12/10/2021 22:19   DG Chest Portable 1 View  Result Date: 12/10/2021 CLINICAL DATA:  Chest pain. EXAM: PORTABLE CHEST 1 VIEW COMPARISON:  Chest radiograph dated 12/28/2017. FINDINGS: The heart size and mediastinal contours are within normal limits. Both lungs are clear. The visualized skeletal structures are unremarkable. IMPRESSION: No active disease. Electronically Signed   By: Elgie Collard M.D.   On: 12/10/2021 20:00    ASSESSMENT: Iron deficiency anemia.  PLAN:    Iron deficiency anemia: Patient's hemoglobin has improved to 9.5 after receiving 2 units of packed red blood cells as well as 2 doses of IV Venofer as an inpatient.  Proceed with 200 mg IV Venofer today.  Patient will return to clinic later this week and 1 more time next week to receive additional treatment for a total of 5.  She would then return to clinic in February 2024 for repeat laboratory work, further evaluation, and continuation of treatment if  needed. Thrombocytosis: Likely reactive.  Of note, patient was found to have thrombocytopenia while inpatient.  Monitor.  I spent a total of 30 minutes reviewing chart data, face-to-face evaluation with the patient, counseling and coordination of care as detailed above.   Patient expressed understanding and was in agreement with this plan. She also understands that She can call clinic  at any time with any questions, concerns, or complaints.    Lloyd Huger, MD   12/23/2021 2:07 PM

## 2021-12-24 MED FILL — Iron Sucrose Inj 20 MG/ML (Fe Equiv): INTRAVENOUS | Qty: 10 | Status: AC

## 2021-12-25 ENCOUNTER — Inpatient Hospital Stay: Payer: BC Managed Care – PPO

## 2021-12-25 VITALS — BP 104/62 | HR 78 | Resp 17

## 2021-12-25 DIAGNOSIS — D509 Iron deficiency anemia, unspecified: Secondary | ICD-10-CM

## 2021-12-25 MED ORDER — SODIUM CHLORIDE 0.9 % IV SOLN
200.0000 mg | Freq: Once | INTRAVENOUS | Status: AC
  Administered 2021-12-25: 200 mg via INTRAVENOUS
  Filled 2021-12-25: qty 200

## 2021-12-25 MED ORDER — SODIUM CHLORIDE 0.9% FLUSH
10.0000 mL | Freq: Once | INTRAVENOUS | Status: AC | PRN
  Administered 2021-12-25: 10 mL
  Filled 2021-12-25: qty 10

## 2021-12-25 MED ORDER — SODIUM CHLORIDE 0.9 % IV SOLN
Freq: Once | INTRAVENOUS | Status: AC
  Filled 2021-12-25: qty 250

## 2021-12-25 NOTE — Progress Notes (Signed)
Patient tolerated Venofer infusion well, no questions/concerns voiced. Monitored 30 min post transfusion. Patient stable at discharge. VSS. AVS given.    

## 2022-01-08 ENCOUNTER — Inpatient Hospital Stay: Payer: BC Managed Care – PPO | Attending: Oncology

## 2022-01-08 VITALS — BP 112/65 | HR 69 | Temp 96.4°F | Resp 18

## 2022-01-08 DIAGNOSIS — D509 Iron deficiency anemia, unspecified: Secondary | ICD-10-CM | POA: Insufficient documentation

## 2022-01-08 MED ORDER — SODIUM CHLORIDE 0.9 % IV SOLN
Freq: Once | INTRAVENOUS | Status: AC
  Filled 2022-01-08: qty 250

## 2022-01-08 MED ORDER — SODIUM CHLORIDE 0.9 % IV SOLN
200.0000 mg | Freq: Once | INTRAVENOUS | Status: AC
  Administered 2022-01-08: 200 mg via INTRAVENOUS
  Filled 2022-01-08: qty 200

## 2022-01-08 NOTE — Patient Instructions (Signed)

## 2022-02-11 ENCOUNTER — Encounter: Payer: Self-pay | Admitting: Oncology

## 2022-02-24 ENCOUNTER — Inpatient Hospital Stay: Payer: BC Managed Care – PPO | Attending: Oncology

## 2022-02-24 DIAGNOSIS — D509 Iron deficiency anemia, unspecified: Secondary | ICD-10-CM | POA: Diagnosis present

## 2022-02-24 LAB — CBC WITH DIFFERENTIAL/PLATELET
Abs Immature Granulocytes: 0.01 10*3/uL (ref 0.00–0.07)
Basophils Absolute: 0 10*3/uL (ref 0.0–0.1)
Basophils Relative: 0 %
Eosinophils Absolute: 0.1 10*3/uL (ref 0.0–0.5)
Eosinophils Relative: 1 %
HCT: 37.1 % (ref 36.0–46.0)
Hemoglobin: 11.7 g/dL — ABNORMAL LOW (ref 12.0–15.0)
Immature Granulocytes: 0 %
Lymphocytes Relative: 12 %
Lymphs Abs: 0.6 10*3/uL — ABNORMAL LOW (ref 0.7–4.0)
MCH: 26.4 pg (ref 26.0–34.0)
MCHC: 31.5 g/dL (ref 30.0–36.0)
MCV: 83.7 fL (ref 80.0–100.0)
Monocytes Absolute: 0.4 10*3/uL (ref 0.1–1.0)
Monocytes Relative: 9 %
Neutro Abs: 3.5 10*3/uL (ref 1.7–7.7)
Neutrophils Relative %: 78 %
Platelets: 197 10*3/uL (ref 150–400)
RBC: 4.43 MIL/uL (ref 3.87–5.11)
RDW: 18.8 % — ABNORMAL HIGH (ref 11.5–15.5)
WBC: 4.5 10*3/uL (ref 4.0–10.5)
nRBC: 0 % (ref 0.0–0.2)

## 2022-02-24 LAB — FERRITIN: Ferritin: 10 ng/mL — ABNORMAL LOW (ref 11–307)

## 2022-02-24 LAB — IRON AND TIBC
Iron: 79 ug/dL (ref 28–170)
Saturation Ratios: 21 % (ref 10.4–31.8)
TIBC: 375 ug/dL (ref 250–450)
UIBC: 296 ug/dL

## 2022-02-25 ENCOUNTER — Inpatient Hospital Stay (HOSPITAL_BASED_OUTPATIENT_CLINIC_OR_DEPARTMENT_OTHER): Payer: BC Managed Care – PPO | Admitting: Oncology

## 2022-02-25 ENCOUNTER — Inpatient Hospital Stay: Payer: BC Managed Care – PPO

## 2022-02-25 ENCOUNTER — Encounter: Payer: Self-pay | Admitting: Oncology

## 2022-02-25 VITALS — BP 111/55 | HR 74 | Temp 96.1°F | Resp 16 | Ht 67.0 in | Wt 132.0 lb

## 2022-02-25 DIAGNOSIS — D509 Iron deficiency anemia, unspecified: Secondary | ICD-10-CM

## 2022-02-25 MED ORDER — SODIUM CHLORIDE 0.9 % IV SOLN
200.0000 mg | Freq: Once | INTRAVENOUS | Status: AC
  Administered 2022-02-25: 200 mg via INTRAVENOUS
  Filled 2022-02-25: qty 200

## 2022-02-25 MED ORDER — SODIUM CHLORIDE 0.9 % IV SOLN
Freq: Once | INTRAVENOUS | Status: AC
  Filled 2022-02-25: qty 250

## 2022-02-25 NOTE — Progress Notes (Signed)
[name]  [phone] [phone]) [phone]  ID: Alexis [name] OB: [birth year]  MR#: [id number]  [id number]  Patient Care Team: System, Provider Not In as PCP - General  CHIEF COMPLAINT: Iron deficiency anemia.  INTERVAL HISTORY: Patient returns to clinic today for repeat laboratory work, further evaluation, and consideration of additional Venofer.  She feels improved since receiving treatment several months ago.  She does not complain of any weakness or fatigue today. She has no neurologic complaints.  She denies any recent fevers or illnesses.  She has a good appetite and denies weight loss.  She has no chest pain, shortness of breath, cough, or hemoptysis.  She denies any nausea, vomiting, constipation, or diarrhea.  She has no melena or hematochezia.  She has no urinary complaints.  Patient offers no specific complaints today.  REVIEW OF SYSTEMS:   Review of Systems  Constitutional: Negative.  Negative for fever, malaise/fatigue and weight loss.  Respiratory: Negative.  Negative for cough, hemoptysis and shortness of breath.   Cardiovascular: Negative.  Negative for chest pain and leg swelling.  Gastrointestinal: Negative.  Negative for abdominal pain, blood in stool and melena.  Genitourinary: Negative.  Negative for dysuria.  Musculoskeletal: Negative.  Negative for back pain.  Skin: Negative.  Negative for rash.  Neurological: Negative.  Negative for dizziness, focal weakness, weakness and headaches.  Psychiatric/Behavioral:  The patient is not nervous/anxious.     As per HPI. Otherwise, a complete review of systems is negative.  PAST MEDICAL HISTORY: History reviewed. No pertinent past medical history.  PAST SURGICAL HISTORY: Past Surgical History:  Procedure Laterality Date   NO PAST SURGERIES      FAMILY HISTORY: History reviewed. No pertinent family history.  ADVANCED DIRECTIVES (Y/N):  N  HEALTH MAINTENANCE: Social History    Tobacco Use   Smoking status: Never   Smokeless tobacco: Never  Vaping Use   Vaping Use: Never used  Substance Use Topics   Alcohol use: No   Drug use: Never     Colonoscopy:  PAP:  Bone density:  Lipid panel:  No Known Allergies  Current Outpatient Medications  Medication Sig Dispense Refill   ascorbic acid (VITAMIN C) 500 MG tablet Take 1 tablet (500 mg total) by mouth daily. 90 tablet 0   cyanocobalamin 1000 MCG tablet Take 1 tablet (1,000 mcg total) by mouth daily. 90 tablet 0   iron polysaccharides (NIFEREX) 150 MG capsule Take 1 capsule (150 mg total) by mouth daily. 90 capsule 0   Vitamin D, Ergocalciferol, (DRISDOL) 1.25 MG (50000 UNIT) CAPS capsule Take 1 capsule (50,000 Units total) by mouth every 7 (seven) days. 12 capsule 0   No current facility-administered medications for this visit.    OBJECTIVE: Vitals:   02/25/22 1306  BP: (!) 111/55  Pulse: 74  Resp: 16  Temp: (!) 96.1 F (35.6 C)  SpO2: 100%     Body mass index is 20.67 kg/m.    ECOG FS:0 - Asymptomatic  General: Well-developed, well-nourished, no acute distress. Eyes: Pink conjunctiva, anicteric sclera. HEENT: Normocephalic, moist mucous membranes. Lungs: No audible wheezing or coughing. Heart: Regular rate and rhythm. Abdomen: Soft, nontender, no obvious distention. Musculoskeletal: No edema, cyanosis, or clubbing. Neuro: Alert, answering all questions appropriately. Cranial nerves grossly intact. Skin: No rashes or petechiae noted. Psych: Normal affect.  LAB RESULTS:  Lab Results  Component Value Date   NA 137 12/13/2021   K 3.5 12/13/2021   CL 107 12/13/2021   CO2  26 12/13/2021   GLUCOSE 85 12/13/2021   BUN 9 12/13/2021   CREATININE 0.62 12/13/2021   CALCIUM 8.4 (L) 12/13/2021   PROT 7.9 12/10/2021   ALBUMIN 4.0 12/10/2021   AST 14 (L) 12/10/2021   ALT 9 12/10/2021   ALKPHOS 60 12/10/2021   BILITOT 1.0 12/10/2021   GFRNONAA >60 12/13/2021    Lab Results  Component  Value Date   WBC 4.5 02/24/2022   NEUTROABS 3.5 02/24/2022   HGB 11.7 (L) 02/24/2022   HCT 37.1 02/24/2022   MCV 83.7 02/24/2022   PLT 197 02/24/2022   Lab Results  Component Value Date   IRON 79 02/24/2022   TIBC 375 02/24/2022   IRONPCTSAT 21 02/24/2022   Lab Results  Component Value Date   FERRITIN 10 (L) 02/24/2022     STUDIES: No results found.  ASSESSMENT: Iron deficiency anemia.  PLAN:    Iron deficiency anemia: Patient's hemoglobin is significantly improved to 11.7.  Iron stores have also improved, but she continues to have a mildly decreased ferritin level.  Proceed with 200 mg IV Venofer today.  Patient will return to clinic 1 time over the next week to receive the second infusion.  She will then return to clinic in 4 months with repeat laboratory work, further evaluation, and continuation of treatment if needed.   Thrombocytosis: Resolved.  I spent a total of 30 minutes reviewing chart data, face-to-face evaluation with the patient, counseling and coordination of care as detailed above.    Patient expressed understanding and was in agreement with this plan. She also understands that She can call clinic at any time with any questions, concerns, or complaints.    Lloyd Huger, MD   02/25/2022 1:24 PM

## 2022-03-04 ENCOUNTER — Ambulatory Visit: Payer: BC Managed Care – PPO

## 2022-03-06 ENCOUNTER — Inpatient Hospital Stay: Payer: BC Managed Care – PPO

## 2022-03-06 VITALS — BP 111/66 | HR 80 | Temp 96.0°F | Resp 18

## 2022-03-06 DIAGNOSIS — D509 Iron deficiency anemia, unspecified: Secondary | ICD-10-CM | POA: Diagnosis not present

## 2022-03-06 MED ORDER — SODIUM CHLORIDE 0.9 % IV SOLN
Freq: Once | INTRAVENOUS | Status: AC
  Filled 2022-03-06: qty 250

## 2022-03-06 MED ORDER — SODIUM CHLORIDE 0.9 % IV SOLN
200.0000 mg | Freq: Once | INTRAVENOUS | Status: AC
  Administered 2022-03-06: 200 mg via INTRAVENOUS
  Filled 2022-03-06: qty 200

## 2022-03-06 NOTE — Patient Instructions (Signed)

## 2022-05-02 ENCOUNTER — Ambulatory Visit (INDEPENDENT_AMBULATORY_CARE_PROVIDER_SITE_OTHER): Payer: BC Managed Care – PPO | Admitting: Radiology

## 2022-05-02 ENCOUNTER — Other Ambulatory Visit (HOSPITAL_COMMUNITY)
Admission: RE | Admit: 2022-05-02 | Discharge: 2022-05-02 | Disposition: A | Discharge status: Home / Self Care | Payer: BC Managed Care – PPO | Source: Ambulatory Visit | Attending: Radiology | Admitting: Radiology

## 2022-05-02 ENCOUNTER — Encounter: Payer: Self-pay | Admitting: Radiology

## 2022-05-02 VITALS — BP 100/60 | Ht 64.75 in | Wt 134.0 lb

## 2022-05-02 DIAGNOSIS — Z113 Encounter for screening for infections with a predominantly sexual mode of transmission: Secondary | ICD-10-CM | POA: Insufficient documentation

## 2022-05-02 DIAGNOSIS — Z01419 Encounter for gynecological examination (general) (routine) without abnormal findings: Secondary | ICD-10-CM | POA: Diagnosis not present

## 2022-05-02 NOTE — Progress Notes (Signed)
   Alexis [name] 03/06/1975 [id number]   History:  47 y.o. G2P2 presents for annual exam. No gyn concerns.   Gynecologic History Patient's last menstrual period was 04/10/2022 (exact date). Period Cycle (Days): 28 Period Duration (Days): 3 Period Pattern: Regular Menstrual Flow: Moderate Menstrual Control: Maxi pad Dysmenorrhea: None Contraception/Family planning: tubal ligation Sexually active: yes Last Pap: 2008. Results were: normal   Obstetric History OB History  Gravida Para Term Preterm AB Living  2 2       2   SAB IAB Ectopic Multiple Live Births               # Outcome Date GA Lbr Len/2nd Weight Sex Delivery Anes PTL Lv  2 Para           1 Para              The following portions of the patient's history were reviewed and updated as appropriate: allergies, current medications, past family history, past medical history, past social history, past surgical history, and problem list.  Review of Systems Pertinent items noted in HPI and remainder of comprehensive ROS otherwise negative.   Past medical history, past surgical history, family history and social history were all reviewed and documented in the EPIC chart.   Exam:  Vitals:   05/02/22 1533  BP: 100/60  Weight: 134 lb (60.8 kg)  Height: 5' 4.75" (1.645 m)   Body mass index is 22.47 kg/m.  General appearance:  Normal Thyroid:  Symmetrical, normal in size, without palpable masses or nodularity. Respiratory  Auscultation:  Clear without wheezing or rhonchi Cardiovascular  Auscultation:  Regular rate, without rubs, murmurs or gallops  Edema/varicosities:  Not grossly evident Abdominal  Soft,nontender, without masses, guarding or rebound.  Liver/spleen:  No organomegaly noted  Hernia:  None appreciated  Skin  Inspection:  Grossly normal Breasts: Examined lying and sitting.   Right: Without masses, retractions, nipple discharge or axillary adenopathy.   Left: Without masses, retractions, nipple  discharge or axillary adenopathy. Genitourinary   Inguinal/mons:  Normal without inguinal adenopathy  External genitalia:  Normal appearing vulva with no masses, tenderness, or lesions  BUS/Urethra/Skene's glands:  Normal without masses or exudate  Vagina:  Normal appearing with normal color and discharge, no lesions  Cervix:  Normal appearing without discharge or lesions  Uterus:  Normal in size, shape and contour.  Mobile, nontender  Adnexa/parametria:     Rt: Normal in size, without masses or tenderness.   Lt: Normal in size, without masses or tenderness.  Anus and perineum: Normal   [name], CMA present for exam  Assessment/Plan:   1. Well woman exam with routine gynecological exam - Cytology - PAP( [place]) - Schedule mammogram - Establish PCP  2. Screening for STDs (sexually transmitted diseases) - Cytology - PAP( )     Discussed SBE, colonoscopy and DEXA screening as directed/appropriate. Recommend of exercise weekly, including weight bearing exercise. Encouraged the use of seatbelts and sunscreen. Return in 1 year for annual or as needed.   [name] B WHNP-BC 3:53 PM 05/02/2022

## 2022-05-07 LAB — CYTOLOGY - PAP
Chlamydia: NEGATIVE
Comment: NEGATIVE
Comment: NEGATIVE
Comment: NEGATIVE
Comment: NORMAL
Diagnosis: NEGATIVE
Diagnosis: REACTIVE
High risk HPV: NEGATIVE
Neisseria Gonorrhea: NEGATIVE
Trichomonas: NEGATIVE

## 2022-06-25 ENCOUNTER — Inpatient Hospital Stay: Payer: BC Managed Care – PPO | Attending: Oncology

## 2022-06-25 DIAGNOSIS — D509 Iron deficiency anemia, unspecified: Secondary | ICD-10-CM | POA: Diagnosis present

## 2022-06-25 LAB — CBC WITH DIFFERENTIAL/PLATELET
Abs Immature Granulocytes: 0.02 10*3/uL (ref 0.00–0.07)
Basophils Absolute: 0 10*3/uL (ref 0.0–0.1)
Basophils Relative: 1 %
Eosinophils Absolute: 0.1 10*3/uL (ref 0.0–0.5)
Eosinophils Relative: 1 %
HCT: 33.4 % — ABNORMAL LOW (ref 36.0–46.0)
Hemoglobin: 10.5 g/dL — ABNORMAL LOW (ref 12.0–15.0)
Immature Granulocytes: 0 %
Lymphocytes Relative: 23 %
Lymphs Abs: 1.3 10*3/uL (ref 0.7–4.0)
MCH: 25.4 pg — ABNORMAL LOW (ref 26.0–34.0)
MCHC: 31.4 g/dL (ref 30.0–36.0)
MCV: 80.9 fL (ref 80.0–100.0)
Monocytes Absolute: 0.5 10*3/uL (ref 0.1–1.0)
Monocytes Relative: 9 %
Neutro Abs: 3.7 10*3/uL (ref 1.7–7.7)
Neutrophils Relative %: 66 %
Platelets: 308 10*3/uL (ref 150–400)
RBC: 4.13 MIL/uL (ref 3.87–5.11)
RDW: 14.2 % (ref 11.5–15.5)
WBC: 5.6 10*3/uL (ref 4.0–10.5)
nRBC: 0 % (ref 0.0–0.2)

## 2022-06-25 LAB — FERRITIN: Ferritin: 7 ng/mL — ABNORMAL LOW (ref 11–307)

## 2022-06-25 LAB — IRON AND TIBC
Iron: 33 ug/dL (ref 28–170)
Saturation Ratios: 8 % — ABNORMAL LOW (ref 10.4–31.8)
TIBC: 435 ug/dL (ref 250–450)
UIBC: 402 ug/dL

## 2022-06-25 MED FILL — Iron Sucrose Inj 20 MG/ML (Fe Equiv): INTRAVENOUS | Qty: 10 | Status: AC

## 2022-06-26 ENCOUNTER — Inpatient Hospital Stay: Payer: BC Managed Care – PPO

## 2022-06-26 ENCOUNTER — Inpatient Hospital Stay: Payer: BC Managed Care – PPO | Admitting: Oncology

## 2022-07-04 ENCOUNTER — Other Ambulatory Visit: Payer: Self-pay

## 2022-07-04 DIAGNOSIS — D509 Iron deficiency anemia, unspecified: Secondary | ICD-10-CM

## 2022-07-07 ENCOUNTER — Inpatient Hospital Stay: Payer: BC Managed Care – PPO | Attending: Oncology

## 2022-07-07 DIAGNOSIS — D509 Iron deficiency anemia, unspecified: Secondary | ICD-10-CM | POA: Insufficient documentation

## 2022-07-07 MED FILL — Iron Sucrose Inj 20 MG/ML (Fe Equiv): INTRAVENOUS | Qty: 10 | Status: AC

## 2022-07-08 ENCOUNTER — Inpatient Hospital Stay: Payer: BC Managed Care – PPO | Admitting: Oncology

## 2022-07-08 ENCOUNTER — Inpatient Hospital Stay: Payer: BC Managed Care – PPO

## 2022-07-08 ENCOUNTER — Encounter: Payer: Self-pay | Admitting: Oncology

## 2022-08-04 ENCOUNTER — Inpatient Hospital Stay: Payer: BC Managed Care – PPO

## 2022-08-04 DIAGNOSIS — D509 Iron deficiency anemia, unspecified: Secondary | ICD-10-CM | POA: Diagnosis present

## 2022-08-04 LAB — CBC WITH DIFFERENTIAL (CANCER CENTER ONLY)
Abs Immature Granulocytes: 0.01 10*3/uL (ref 0.00–0.07)
Basophils Absolute: 0 10*3/uL (ref 0.0–0.1)
Basophils Relative: 0 %
Eosinophils Absolute: 0.1 10*3/uL (ref 0.0–0.5)
Eosinophils Relative: 1 %
HCT: 29 % — ABNORMAL LOW (ref 36.0–46.0)
Hemoglobin: 9 g/dL — ABNORMAL LOW (ref 12.0–15.0)
Immature Granulocytes: 0 %
Lymphocytes Relative: 14 %
Lymphs Abs: 0.9 10*3/uL (ref 0.7–4.0)
MCH: 24.4 pg — ABNORMAL LOW (ref 26.0–34.0)
MCHC: 31 g/dL (ref 30.0–36.0)
MCV: 78.6 fL — ABNORMAL LOW (ref 80.0–100.0)
Monocytes Absolute: 0.5 10*3/uL (ref 0.1–1.0)
Monocytes Relative: 7 %
Neutro Abs: 4.9 10*3/uL (ref 1.7–7.7)
Neutrophils Relative %: 78 %
Platelet Count: 310 10*3/uL (ref 150–400)
RBC: 3.69 MIL/uL — ABNORMAL LOW (ref 3.87–5.11)
RDW: 15.9 % — ABNORMAL HIGH (ref 11.5–15.5)
WBC Count: 6.3 10*3/uL (ref 4.0–10.5)
nRBC: 0 % (ref 0.0–0.2)

## 2022-08-04 LAB — FERRITIN: Ferritin: 4 ng/mL — ABNORMAL LOW (ref 11–307)

## 2022-08-04 LAB — IRON AND TIBC
Iron: 19 ug/dL — ABNORMAL LOW (ref 28–170)
Saturation Ratios: 4 % — ABNORMAL LOW (ref 10.4–31.8)
TIBC: 431 ug/dL (ref 250–450)
UIBC: 412 ug/dL

## 2022-08-05 ENCOUNTER — Inpatient Hospital Stay: Payer: BC Managed Care – PPO

## 2022-08-05 ENCOUNTER — Encounter: Payer: Self-pay | Admitting: Oncology

## 2022-08-05 ENCOUNTER — Inpatient Hospital Stay: Payer: BC Managed Care – PPO | Admitting: Oncology

## 2022-08-05 VITALS — BP 104/66 | HR 79 | Temp 97.3°F | Resp 18 | Wt 132.8 lb

## 2022-08-05 DIAGNOSIS — D509 Iron deficiency anemia, unspecified: Secondary | ICD-10-CM | POA: Diagnosis not present

## 2022-08-05 MED ORDER — SODIUM CHLORIDE 0.9 % IV SOLN
Freq: Once | INTRAVENOUS | Status: AC
  Filled 2022-08-05: qty 250

## 2022-08-05 MED ORDER — SODIUM CHLORIDE 0.9 % IV SOLN
200.0000 mg | Freq: Once | INTRAVENOUS | Status: AC
  Administered 2022-08-05: 200 mg via INTRAVENOUS
  Filled 2022-08-05: qty 200

## 2022-08-05 NOTE — Progress Notes (Signed)
Patient here today for follow up regarding anemia.  

## 2022-08-05 NOTE — Progress Notes (Signed)
[facility]  Telephone:(336) [phone] Fax:(336) [phone]  ID: Alexis [name] OB: 1975/01/27  MR#: [id number]  EPP#:[id number]  Patient Care Team: System, Provider Not In as PCP - General Chrzanowski, Lamona Curl, NP as Nurse Practitioner (Radiology)  CHIEF COMPLAINT: Iron deficiency anemia.  INTERVAL HISTORY: Patient returns to clinic today for repeat laboratory work, further evaluation, and consideration of additional IV Venofer.  Her work schedule has changed, but she has also noticed increasing weakness and fatigue.  She otherwise feels well.  She has no neurologic complaints.  She denies any recent fevers or illnesses.  She has a good appetite and denies weight loss.  She has no chest pain, shortness of breath, cough, or hemoptysis.  She denies any nausea, vomiting, constipation, or diarrhea.  She has no melena or hematochezia.  She has no urinary complaints.  Patient offers no further specific complaints today.  REVIEW OF SYSTEMS:   Review of Systems  Constitutional: Negative.  Negative for fever, malaise/fatigue and weight loss.  Respiratory: Negative.  Negative for cough, hemoptysis and shortness of breath.   Cardiovascular: Negative.  Negative for chest pain and leg swelling.  Gastrointestinal: Negative.  Negative for abdominal pain, blood in stool and melena.  Genitourinary: Negative.  Negative for dysuria.  Musculoskeletal: Negative.  Negative for back pain.  Skin: Negative.  Negative for rash.  Neurological: Negative.  Negative for dizziness, focal weakness, weakness and headaches.  Psychiatric/Behavioral:  The patient is not nervous/anxious.     As per HPI. Otherwise, a complete review of systems is negative.  PAST MEDICAL HISTORY: Past Medical History:  Diagnosis Date   Anemia     PAST SURGICAL HISTORY: Past Surgical History:  Procedure Laterality Date   NO PAST SURGERIES     TUBAL LIGATION      FAMILY HISTORY: Family History  Problem Relation  Age of Onset   Hypertension Mother    Diabetes Mother    Hypertension Father    Hypertension Brother     ADVANCED DIRECTIVES (Y/N):  N  HEALTH MAINTENANCE: Social History   Tobacco Use   Smoking status: Never    Passive exposure: Never   Smokeless tobacco: Never  Vaping Use   Vaping status: Never Used  Substance Use Topics   Alcohol use: No   Drug use: Never     Colonoscopy:  PAP:  Bone density:  Lipid panel:  No Known Allergies  Current Outpatient Medications  Medication Sig Dispense Refill   iron polysaccharides (NIFEREX) 150 MG capsule Take 1 capsule (150 mg total) by mouth daily. 90 capsule 0   No current facility-administered medications for this visit.    OBJECTIVE: Vitals:   08/05/22 1317  BP: 104/66  Pulse: 79  Resp: 18  Temp: (!) 97.3 F (36.3 C)  SpO2: 100%     Body mass index is 22.27 kg/m.    ECOG FS:0 - Asymptomatic  General: Well-developed, well-nourished, no acute distress. Eyes: Pink conjunctiva, anicteric sclera. HEENT: Normocephalic, moist mucous membranes. Lungs: No audible wheezing or coughing. Heart: Regular rate and rhythm. Abdomen: Soft, nontender, no obvious distention. Musculoskeletal: No edema, cyanosis, or clubbing. Neuro: Alert, answering all questions appropriately. Cranial nerves grossly intact. Skin: No rashes or petechiae noted. Psych: Normal affect.  LAB RESULTS:  Lab Results  Component Value Date   NA 137 12/13/2021   K 3.5 12/13/2021   CL 107 12/13/2021   CO2 26 12/13/2021   GLUCOSE 85 12/13/2021   BUN 9 12/13/2021   CREATININE 0.62  12/13/2021   CALCIUM 8.4 (L) 12/13/2021   PROT 7.9 12/10/2021   ALBUMIN 4.0 12/10/2021   AST 14 (L) 12/10/2021   ALT 9 12/10/2021   ALKPHOS 60 12/10/2021   BILITOT 1.0 12/10/2021   GFRNONAA >60 12/13/2021    Lab Results  Component Value Date   WBC 6.3 08/04/2022   NEUTROABS 4.9 08/04/2022   HGB 9.0 (L) 08/04/2022   HCT 29.0 (L) 08/04/2022   MCV 78.6 (L) 08/04/2022    PLT 310 08/04/2022   Lab Results  Component Value Date   IRON 19 (L) 08/04/2022   TIBC 431 08/04/2022   IRONPCTSAT 4 (L) 08/04/2022   Lab Results  Component Value Date   FERRITIN 4 (L) 08/04/2022     STUDIES: No results found.  ASSESSMENT: Iron deficiency anemia.  PLAN:    Iron deficiency anemia: Patient's hemoglobin and iron stores have trended down and she is more symptomatic.  Proceed with 200 mg IV Venofer today.  Return to clinic 4 times over the next several weeks to receive additional treatments.  Will attempt to coordinate with patient's work schedule.  Patient will then return to clinic in 4 months with repeat laboratory work, further evaluation, and continuation of treatment if needed.    I spent a total of 30 minutes reviewing chart data, face-to-face evaluation with the patient, counseling and coordination of care as detailed above.    Patient expressed understanding and was in agreement with this plan. She also understands that She can call clinic at any time with any questions, concerns, or complaints.    Jeralyn Ruths, MD   08/05/2022 2:33 PM

## 2022-08-12 ENCOUNTER — Inpatient Hospital Stay: Payer: BC Managed Care – PPO | Attending: Oncology

## 2022-08-12 VITALS — BP 111/62 | HR 72 | Temp 98.1°F | Resp 16

## 2022-08-12 DIAGNOSIS — D509 Iron deficiency anemia, unspecified: Secondary | ICD-10-CM | POA: Diagnosis not present

## 2022-08-12 MED ORDER — SODIUM CHLORIDE 0.9 % IV SOLN
200.0000 mg | Freq: Once | INTRAVENOUS | Status: AC
  Administered 2022-08-12: 200 mg via INTRAVENOUS
  Filled 2022-08-12: qty 200

## 2022-08-12 MED ORDER — SODIUM CHLORIDE 0.9 % IV SOLN
Freq: Once | INTRAVENOUS | Status: AC
  Filled 2022-08-12: qty 250

## 2022-08-12 NOTE — Patient Instructions (Signed)
Iron Sucrose Injection What is this medication? IRON SUCROSE (EYE ern SOO krose) treats low levels of iron (iron deficiency anemia) in people with kidney disease. Iron is a mineral that plays an important role in making red blood cells, which carry oxygen from your lungs to the rest of your body. This medicine may be used for other purposes; ask your health care provider or pharmacist if you have questions. COMMON BRAND NAME(S): Venofer What should I tell my care team before I take this medication? They need to know if you have any of these conditions: Anemia not caused by low iron levels Heart disease High levels of iron in the blood Kidney disease Liver disease An unusual or allergic reaction to iron, other medications, foods, dyes, or preservatives Pregnant or trying to get pregnant Breastfeeding How should I use this medication? This medication is for infusion into a vein. It is given in a hospital or clinic setting. Talk to your care team about the use of this medication in children. While this medication may be prescribed for children as young as 2 years for selected conditions, precautions do apply. Overdosage: If you think you have taken too much of this medicine contact a poison control center or emergency room at once. NOTE: This medicine is only for you. Do not share this medicine with others. What if I miss a dose? Keep appointments for follow-up doses. It is important not to miss your dose. Call your care team if you are unable to keep an appointment. What may interact with this medication? Do not take this medication with any of the following: Deferoxamine Dimercaprol Other iron products This medication may also interact with the following: Chloramphenicol Deferasirox This list may not describe all possible interactions. Give your health care provider a list of all the medicines, herbs, non-prescription drugs, or dietary supplements you use. Also tell them if you smoke,  drink alcohol, or use illegal drugs. Some items may interact with your medicine. What should I watch for while using this medication? Visit your care team regularly. Tell your care team if your symptoms do not start to get better or if they get worse. You may need blood work done while you are taking this medication. You may need to follow a special diet. Talk to your care team. Foods that contain iron include: whole grains/cereals, dried fruits, beans, or peas, leafy green vegetables, and organ meats (liver, kidney). What side effects may I notice from receiving this medication? Side effects that you should report to your care team as soon as possible: Allergic reactions--skin rash, itching, hives, swelling of the face, lips, tongue, or throat Low blood pressure--dizziness, feeling faint or lightheaded, blurry vision Shortness of breath Side effects that usually do not require medical attention (report to your care team if they continue or are bothersome): Flushing Headache Joint pain Muscle pain Nausea Pain, redness, or irritation at injection site This list may not describe all possible side effects. Call your doctor for medical advice about side effects. You may report side effects to FDA at 1-800-FDA-1088. Where should I keep my medication? This medication is given in a hospital or clinic. It will not be stored at home. NOTE: This sheet is a summary. It may not cover all possible information. If you have questions about this medicine, talk to your doctor, pharmacist, or health care provider.  2024 Elsevier/Gold Standard (2022-05-30 00:00:00)

## 2022-08-19 ENCOUNTER — Inpatient Hospital Stay: Payer: BC Managed Care – PPO

## 2022-08-19 VITALS — BP 99/58 | HR 78 | Temp 97.9°F

## 2022-08-19 DIAGNOSIS — D509 Iron deficiency anemia, unspecified: Secondary | ICD-10-CM | POA: Diagnosis not present

## 2022-08-19 MED ORDER — SODIUM CHLORIDE 0.9 % IV SOLN
200.0000 mg | Freq: Once | INTRAVENOUS | Status: AC
  Administered 2022-08-19: 200 mg via INTRAVENOUS
  Filled 2022-08-19: qty 200

## 2022-08-19 MED ORDER — SODIUM CHLORIDE 0.9 % IV SOLN
INTRAVENOUS | Status: DC
  Filled 2022-08-19 (×2): qty 250

## 2022-08-26 ENCOUNTER — Inpatient Hospital Stay: Payer: BC Managed Care – PPO

## 2022-08-26 VITALS — BP 102/52 | HR 75 | Temp 98.5°F | Resp 16

## 2022-08-26 DIAGNOSIS — D509 Iron deficiency anemia, unspecified: Secondary | ICD-10-CM

## 2022-08-26 MED ORDER — SODIUM CHLORIDE 0.9 % IV SOLN
200.0000 mg | Freq: Once | INTRAVENOUS | Status: AC
  Administered 2022-08-26: 200 mg via INTRAVENOUS
  Filled 2022-08-26: qty 200

## 2022-08-26 MED ORDER — SODIUM CHLORIDE 0.9 % IV SOLN
INTRAVENOUS | Status: DC
  Filled 2022-08-26: qty 250

## 2022-09-02 ENCOUNTER — Inpatient Hospital Stay: Payer: BC Managed Care – PPO

## 2022-09-02 VITALS — BP 111/66 | HR 77 | Temp 98.7°F

## 2022-09-02 DIAGNOSIS — D509 Iron deficiency anemia, unspecified: Secondary | ICD-10-CM | POA: Diagnosis not present

## 2022-09-02 MED ORDER — SODIUM CHLORIDE 0.9 % IV SOLN
Freq: Once | INTRAVENOUS | Status: AC
  Filled 2022-09-02: qty 250

## 2022-09-02 MED ORDER — SODIUM CHLORIDE 0.9 % IV SOLN
200.0000 mg | Freq: Once | INTRAVENOUS | Status: AC
  Administered 2022-09-02: 200 mg via INTRAVENOUS
  Filled 2022-09-02: qty 200

## 2022-10-12 DIAGNOSIS — N921 Excessive and frequent menstruation with irregular cycle: Secondary | ICD-10-CM

## 2022-10-13 NOTE — Telephone Encounter (Signed)
 Left message to call [facility] Triage at [phone], option 4.

## 2022-10-23 ENCOUNTER — Ambulatory Visit (INDEPENDENT_AMBULATORY_CARE_PROVIDER_SITE_OTHER): Payer: BC Managed Care – PPO | Admitting: Radiology

## 2022-10-23 ENCOUNTER — Encounter: Payer: Self-pay | Admitting: Radiology

## 2022-10-23 VITALS — BP 120/78 | Wt 137.0 lb

## 2022-10-23 DIAGNOSIS — R6889 Other general symptoms and signs: Secondary | ICD-10-CM

## 2022-10-23 DIAGNOSIS — D509 Iron deficiency anemia, unspecified: Secondary | ICD-10-CM

## 2022-10-23 DIAGNOSIS — N920 Excessive and frequent menstruation with regular cycle: Secondary | ICD-10-CM | POA: Diagnosis not present

## 2022-10-23 NOTE — Progress Notes (Signed)
   Alexis [name] [birth year] [id number]   History:  47 y.o. G2P2 s/p BTL presents with c/o heavy periods x 6-8 months. Periods were previously 3-4 days and light. Now, periods are heavy 4-5 days, changing pads every for the first two days then every 2-3 hours. Often leaks through her pads through her clothes. Denies any pain. No hx of fibroids or previous heavy bleeding. Does have anemia and is taking iron daily. She also reports around the same time she developed a heat intolerance at night and sometimes during the day. Would not describe as hot flashes because it lasts for hours. She sleep on top of the covers with a fan on at night.  Gynecologic History Patient's last menstrual period was 10/09/2022 (exact date).     Obstetric History OB History  Gravida Para Term Preterm AB Living  2 2       2   SAB IAB Ectopic Multiple Live Births               # Outcome Date GA Lbr Len/2nd Weight Sex Type Anes PTL Lv  2 Para           1 Para             The following portions of the patient's history were reviewed and updated as appropriate: allergies, current medications, past family history, past medical history, past social history, past surgical history, and problem list.  Review of Systems  Constitutional: Negative.   Cardiovascular: Negative.   Gastrointestinal:  Negative for abdominal pain, blood in stool, constipation, nausea and vomiting.  Genitourinary: Negative.  Negative for dysuria, flank pain, frequency, hematuria and urgency.  Endo/Heme/Allergies:  Does not bruise/bleed easily.  Psychiatric/Behavioral: Negative.  Negative for depression, memory loss and substance abuse. The patient is not nervous/anxious and does not have insomnia.     Past medical history, past surgical history, family history and social history were all reviewed and documented in the EPIC chart.  Exam:  Vitals:   10/23/22 0813  BP: 120/78  Weight: 137 lb (62.1 kg)   Body mass index is 22.97  kg/m.  Physical Exam Vitals and nursing note reviewed.  Constitutional:      Appearance: Normal appearance. She is normal weight.  Cardiovascular:     Rate and Rhythm: Normal rate and regular rhythm.  Pulmonary:     Effort: Pulmonary effort is normal.  Abdominal:     General: Abdomen is flat.     Palpations: Abdomen is soft.  Neurological:     Mental Status: She is alert.  Psychiatric:        Mood and Affect: Mood normal.        Thought Content: Thought content normal.        Judgment: Judgment normal.     [name], CMA present for exam  Assessment/Plan:   1. Menorrhagia with regular cycle - US PELVIC COMPLETE WITH TRANSVAGINAL; Future  2. Heat intolerance - FSH - Thyroid Panel With TSH  3. Iron deficiency anemia, unspecified iron deficiency anemia type - CBC - B12 and Folate Panel - Iron, TIBC and Ferritin Panel   Will contact with results once received and manage accordingly. Patient is agreeable to plan.  [name] B WHNP-BC 8:27 AM 10/23/2022

## 2022-10-24 LAB — CBC
HCT: 35 % (ref 35.0–45.0)
Hemoglobin: 10.9 g/dL — ABNORMAL LOW (ref 11.7–15.5)
MCH: 26.6 pg — ABNORMAL LOW (ref 27.0–33.0)
MCHC: 31.1 g/dL — ABNORMAL LOW (ref 32.0–36.0)
MCV: 85.4 fL (ref 80.0–100.0)
MPV: 10.7 fL (ref 7.5–12.5)
Platelets: 302 10*3/uL (ref 140–400)
RBC: 4.1 10*6/uL (ref 3.80–5.10)
RDW: 14.8 % (ref 11.0–15.0)
WBC: 8.3 10*3/uL (ref 3.8–10.8)

## 2022-10-24 LAB — FOLLICLE STIMULATING HORMONE: FSH: 8.8 m[IU]/mL

## 2022-10-24 LAB — IRON,TIBC AND FERRITIN PANEL
%SAT: 12 % — ABNORMAL LOW (ref 16–45)
Ferritin: 19 ng/mL (ref 16–232)
Iron: 39 ug/dL — ABNORMAL LOW (ref 40–190)
TIBC: 320 ug/dL (ref 250–450)

## 2022-10-24 LAB — B12 AND FOLATE PANEL
Folate: 6.6 ng/mL
Vitamin B-12: 383 pg/mL (ref 200–1100)

## 2022-10-24 LAB — THYROID PANEL WITH TSH
Free Thyroxine Index: 1.9 (ref 1.4–3.8)
T3 Uptake: 31 % (ref 22–35)
T4, Total: 6.1 ug/dL (ref 5.1–11.9)
TSH: 1.82 m[IU]/L

## 2022-10-30 ENCOUNTER — Ambulatory Visit (HOSPITAL_COMMUNITY)
Admission: RE | Admit: 2022-10-30 | Discharge: 2022-10-30 | Disposition: A | Discharge status: Home / Self Care | Payer: BC Managed Care – PPO | Source: Ambulatory Visit | Attending: Radiology | Admitting: Radiology

## 2022-10-30 DIAGNOSIS — N920 Excessive and frequent menstruation with regular cycle: Secondary | ICD-10-CM | POA: Insufficient documentation

## 2022-11-03 NOTE — Telephone Encounter (Signed)
Call placed to patient, left detailed message, ok per dpr.  Advised of results per Dr. Edward Jolly. Return call to office at [phone], option 4,  to further discuss results and schedule f/u.

## 2022-11-03 NOTE — Telephone Encounter (Signed)
Call placed to [facility] Radiology Reading room, requested expedited PUS.   Spoke with patient. Patient reports bleeding  started again on 11/02/22. Reports as heavy, changes a full pad q30 min. Denies pain or s/s of anemia. Awaiting PUS results. Advised if bleeding continues to be heavy and/or any new symptoms such as SOB, lightheadedness, weakness or fatigue develop, return call to office, ER/Urgent care if after hours. Once PUS results are back and reviewed by provider, our office will contact you. Suszanne Finch is out of the office today, will send to covering provider to review and f/u if any additional recommendations. Patient verbalizes understanding and is agreeable.   Routing to Dr. Edward Jolly  Cc: Clearnce Hasten

## 2022-11-04 ENCOUNTER — Telehealth: Payer: Self-pay

## 2022-11-04 NOTE — Telephone Encounter (Signed)
Patient left message on voicemail for call back but did not leave details for reason for call back. Left and voiced mail at number on DPR for patient to return call to triage.

## 2022-11-04 NOTE — Addendum Note (Signed)
Addended by: Leda Min on: 11/04/2022 01:35 PM   Modules accepted: Orders

## 2022-11-04 NOTE — Telephone Encounter (Signed)
Spoke with patient. EMB and consult scheduled for 11/12/22 at 0930 with Jami. Patient declined earlier appt. Instructed patient to take Motrin 800 mg with food and water one hour before procedure. Patient reports bleeding has decreased today with motrin.   Order placed.   Routing to provider for final review. Patient is agreeable to disposition. Will close encounter.

## 2022-11-11 NOTE — Telephone Encounter (Signed)
Routing for review

## 2022-11-12 ENCOUNTER — Ambulatory Visit (INDEPENDENT_AMBULATORY_CARE_PROVIDER_SITE_OTHER): Payer: BC Managed Care – PPO | Admitting: Radiology

## 2022-11-12 ENCOUNTER — Encounter: Payer: Self-pay | Admitting: Radiology

## 2022-11-12 VITALS — BP 124/72

## 2022-11-12 DIAGNOSIS — R9389 Abnormal findings on diagnostic imaging of other specified body structures: Secondary | ICD-10-CM | POA: Diagnosis not present

## 2022-11-12 DIAGNOSIS — N882 Stricture and stenosis of cervix uteri: Secondary | ICD-10-CM

## 2022-11-12 DIAGNOSIS — N920 Excessive and frequent menstruation with regular cycle: Secondary | ICD-10-CM

## 2022-11-12 MED ORDER — MISOPROSTOL 200 MCG PO TABS: 400.0000 ug | ORAL_TABLET | Freq: Once | ORAL | 0 refills | Status: DC

## 2022-11-12 NOTE — Progress Notes (Signed)
      ENDOMETRIAL BIOPSY       Alexis [name] 47 y.o. presents for endometrial biopsy. Reason for biopsy: thickened endometrium with heavy menses.  The indications for endometrial biopsy were reviewed.    Risks of the biopsy including cramping, bleeding, infection, uterine perforation, inadequate specimen and need for additional procedures  were discussed.  The patient states she understands and agrees to undergo procedure today. Consent obtained.  Time out was performed.   Narrative & Impression  CLINICAL DATA:  Heavy menstruation.   EXAM: TRANSABDOMINAL AND TRANSVAGINAL ULTRASOUND OF PELVIS   TECHNIQUE: Both transabdominal and transvaginal ultrasound examinations of the pelvis were performed. Transabdominal technique was performed for global imaging of the pelvis including uterus, ovaries, adnexal regions, and pelvic cul-de-sac. It was necessary to proceed with endovaginal exam following the transabdominal exam to visualize the ovaries.   COMPARISON:  December 10, 2021.  May 13, 2005.   FINDINGS: Uterus   Measurements: 8.6 x 5.9 x 5.2 cm = volume: 137 mL. 1.8 cm partially exophytic fibroid is noted posteriorly.   Endometrium   Thickness: 11 mm which is within normal limits. No focal abnormality visualized.   Right ovary   Not visualized.   Left ovary   Measurements: 2.4 x 1.6 x 1.9 cm = volume: 4 mL. 1.5 cm complex cyst is noted concerning for hemorrhagic cyst.   Other findings   No abnormal free fluid.   IMPRESSION: 1.8 cm partially exophytic uterine fibroid.   Endometrial thickness within normal limits. If bleeding remains unresponsive to hormonal or medical therapy, sonohysterogram should be considered for focal lesion work-up. (Ref: Radiological Reasoning: Algorithmic Workup of Abnormal Vaginal Bleeding with Endovaginal Sonography and Sonohysterography. AJR 2008; 161:W96-04).   1.5 cm complex left ovarian cyst concerning for hemorrhagic  cyst. Short-interval follow up ultrasound in 6-12 weeks is recommended, preferably during the week following the patient's normal menses.     Electronically Signed   By: Lupita Raider M.D.   On: 11/03/2022 13:49   Today's Vitals   11/12/22 0908  BP: 124/72   There is no height or weight on file to calculate BMI.   Procedure Speculum inserted into the vagina, cervix visualized and was prepped with Betadine. A single-toothed tenaculum was placed on the anterior lip of the cervix to stabilize it. Stenotic os- Unable to dilate cervix with os finder, or dilators.  The instruments were removed from the patient's vagina.  Minimal bleeding from the cervix was noted.  The patient tolerated the procedure well.   Alexis [name], CMA present for exam  Assessment/Plan: 1. Thickened endometrium  2. Menorrhagia with regular cycle Unable to dilate cervix, will bring back after using cytotec and taking ibuprofen.  3. Cervical os stenosis - misoprostol (CYTOTEC) 200 MCG tablet; Place 2 tablets (400 mcg total) vaginally once for 1 dose. The night before biopsy  Dispense: 1 tablet; Refill: 0   Alexis [name], [facility]

## 2022-11-12 NOTE — Telephone Encounter (Signed)
It should be a total of - so either two or 1 tab

## 2022-11-13 NOTE — Telephone Encounter (Signed)
Added another tablet for a total of .

## 2022-11-19 ENCOUNTER — Ambulatory Visit (INDEPENDENT_AMBULATORY_CARE_PROVIDER_SITE_OTHER): Payer: BC Managed Care – PPO | Admitting: Radiology

## 2022-11-19 ENCOUNTER — Encounter: Payer: Self-pay | Admitting: Radiology

## 2022-11-19 VITALS — BP 138/82

## 2022-11-19 DIAGNOSIS — N882 Stricture and stenosis of cervix uteri: Secondary | ICD-10-CM

## 2022-11-19 DIAGNOSIS — N921 Excessive and frequent menstruation with irregular cycle: Secondary | ICD-10-CM

## 2022-11-19 DIAGNOSIS — D509 Iron deficiency anemia, unspecified: Secondary | ICD-10-CM | POA: Diagnosis not present

## 2022-11-19 DIAGNOSIS — R9389 Abnormal findings on diagnostic imaging of other specified body structures: Secondary | ICD-10-CM

## 2022-11-19 NOTE — Progress Notes (Signed)
      ENDOMETRIAL BIOPSY       Alexis [name] 47 y.o. presents for endometrial biopsy after using cytotec last night. Unsuccessful biopsy last week in office.   Reason for biopsy: thickened endometrium (11mm) with heavy menses and anemia.  The indications for endometrial biopsy were reviewed.    Risks of the biopsy including cramping, bleeding, infection, uterine perforation, inadequate specimen and need for additional procedures  were discussed.  The patient states she understands and agrees to undergo procedure today. Consent obtained.  Time out was performed.   Narrative & Impression  CLINICAL DATA:  Heavy menstruation.   EXAM: TRANSABDOMINAL AND TRANSVAGINAL ULTRASOUND OF PELVIS   TECHNIQUE: Both transabdominal and transvaginal ultrasound examinations of the pelvis were performed. Transabdominal technique was performed for global imaging of the pelvis including uterus, ovaries, adnexal regions, and pelvic cul-de-sac. It was necessary to proceed with endovaginal exam following the transabdominal exam to visualize the ovaries.   COMPARISON:  December 10, 2021.  May 13, 2005.   FINDINGS: Uterus   Measurements: 8.6 x 5.9 x 5.2 cm = volume: 137 mL. 1.8 cm partially exophytic fibroid is noted posteriorly.   Endometrium   Thickness: 11 mm which is within normal limits. No focal abnormality visualized.   Right ovary   Not visualized.   Left ovary   Measurements: 2.4 x 1.6 x 1.9 cm = volume: 4 mL. 1.5 cm complex cyst is noted concerning for hemorrhagic cyst.   Other findings   No abnormal free fluid.   IMPRESSION: 1.8 cm partially exophytic uterine fibroid.   Endometrial thickness within normal limits. If bleeding remains unresponsive to hormonal or medical therapy, sonohysterogram should be considered for focal lesion work-up. (Ref: Radiological Reasoning: Algorithmic Workup of Abnormal Vaginal Bleeding with Endovaginal Sonography and Sonohysterography. AJR  2008; 409:W11-91).   1.5 cm complex left ovarian cyst concerning for hemorrhagic cyst. Short-interval follow up ultrasound in 6-12 weeks is recommended, preferably during the week following the patient's normal menses.     Electronically Signed   By: Lupita Raider M.D.   On: 11/03/2022 13:49   Today's Vitals   11/19/22 0957  BP: 138/82   There is no height or weight on file to calculate BMI.   Procedure Speculum inserted into the vagina, cervix visualized and was prepped with Betadine. A single-toothed tenaculum was placed on the anterior lip of the cervix to stabilize it. Paracervical block completed with a total for 3ml of lidocaine at 10 oclock, 2 oclock and 6 oclock. Os remained stenotic - Unable to dilate cervix with os finder, or dilators.  The instruments were removed from the patient's vagina.  Minimal bleeding from the cervix was noted.  The patient tolerated the procedure well.   [name], CMA present for exam  Assessment/Plan: 1. Cervical os stenosis  2. Thickened endometrium  3. Iron deficiency anemia, unspecified iron deficiency anemia type  4. Menorrhagia with irregular cycle Unable to compete EMB, Will bring in for surgical consult to discuss hysteroscopy D&C   [name], [facility]

## 2022-11-24 ENCOUNTER — Encounter: Payer: Self-pay | Admitting: Obstetrics and Gynecology

## 2022-11-24 ENCOUNTER — Ambulatory Visit (INDEPENDENT_AMBULATORY_CARE_PROVIDER_SITE_OTHER): Payer: BC Managed Care – PPO | Admitting: Obstetrics and Gynecology

## 2022-11-24 VITALS — BP 110/76 | HR 68 | Ht 65.75 in | Wt 135.0 lb

## 2022-11-24 DIAGNOSIS — N921 Excessive and frequent menstruation with irregular cycle: Secondary | ICD-10-CM | POA: Insufficient documentation

## 2022-11-24 MED ORDER — TRANEXAMIC ACID 650 MG PO TABS: 1300.0000 mg | ORAL_TABLET | Freq: Three times a day (TID) | ORAL | 0 refills | Status: AC

## 2022-11-24 NOTE — Assessment & Plan Note (Addendum)
Hormonal work-up wnl US unremarkable Endometrial sampling via EMB failed x2, recommend sampling due to age. First line therapies reviewed, including hormonal therapy and tranexamic acid. Patient is interested in Mirena IUD. Pamphlets provided. Will send rx for lysteda until procedure. Orders placed for diagnostic hysteroscopy, D&C, Mirena IUD insertion. Discussed outpatient procedure. Reviewed that  recovery is usually 1-2 days. Risks including infections, bleeding, and damage to surrounding organs reviewed. Recommend NPO prior to midnight and reviewed medication to take on day of surgery. Dicussed use of NSAIDS as needed for pain postoperatively. All questions answered.  Preop checklist: Antibiotics: none DVT ppx: SCDs Postop visit: 1 week Additional clearance: none

## 2022-11-24 NOTE — Progress Notes (Signed)
47 y.o. G2P2 female s/p BTL with AUB (regular, HVB), aenmia here for surgical consultation for diagnostic hysteroscopy, D&C.  Patient's last menstrual period was 10/16/2022 (approximate).   Had 2 cycles in October with really HVB. No CP or SOB.  Last PAP:    Component Value Date/Time   DIAGPAP  05/02/2022 1553    - Negative for Intraepithelial Lesions or Malignancy (NILM)   DIAGPAP - Benign reactive/reparative changes 05/02/2022 1553   HPVHIGH Negative 05/02/2022 1553   ADEQPAP  05/02/2022 1553    Satisfactory for evaluation; transformation zone component PRESENT.   Birth control: BTL  GYN HISTORY: No significant history  OB History  Gravida Para Term Preterm AB Living  2 2       2   SAB IAB Ectopic Multiple Live Births               # Outcome Date GA Lbr Len/2nd Weight Sex Type Anes PTL Lv  2 Para           1 Para             Past Medical History:  Diagnosis Date   Anemia     Past Surgical History:  Procedure Laterality Date   NO PAST SURGERIES     TUBAL LIGATION      Current Outpatient Medications on File Prior to Visit  Medication Sig Dispense Refill   iron polysaccharides (NIFEREX) 150 MG capsule Take 1 capsule (150 mg total) by mouth daily. 90 capsule 0   [DISCONTINUED] albuterol (PROVENTIL HFA;VENTOLIN HFA) 108 (90 Base) MCG/ACT inhaler Inhale 2 puffs into the lungs every 4 (four) hours as needed for wheezing or shortness of breath. 1 Inhaler 0   No current facility-administered medications on file prior to visit.    No Known Allergies    PE Today's Vitals   11/24/22 0924  BP: 110/76  Pulse: 68  SpO2: 100%  Weight: 135 lb (61.2 kg)  Height: 5' 5.75" (1.67 m)   Body mass index is 21.96 kg/m.  Physical Exam Vitals reviewed.  Constitutional:      General: She is not in acute distress.    Appearance: Normal appearance.  HENT:     Head: Normocephalic and atraumatic.     Nose: Nose normal.  Eyes:     Extraocular Movements: Extraocular  movements intact.     Conjunctiva/sclera: Conjunctivae normal.  Cardiovascular:     Rate and Rhythm: Normal rate and regular rhythm.     Heart sounds: No murmur heard.    No friction rub. No gallop.  Pulmonary:     Effort: Pulmonary effort is normal. No respiratory distress.     Breath sounds: Normal breath sounds. No stridor. No wheezing, rhonchi or rales.  Musculoskeletal:        General: Normal range of motion.     Cervical back: Normal range of motion.  Neurological:     General: No focal deficit present.     Mental Status: She is alert.  Psychiatric:        Mood and Affect: Mood normal.        Behavior: Behavior normal.       Assessment and Plan:        Menorrhagia with irregular cycle Assessment & Plan: Hormonal work-up wnl US unremarkable Endometrial sampling via EMB failed x2, recommend sampling due to age. First line therapies reviewed, including hormonal therapy and tranexamic acid. Patient is interested in Mirena IUD. Pamphlets provided. Will send rx for  lysteda until procedure. Orders placed for diagnostic hysteroscopy, D&C, Mirena IUD insertion. Discussed outpatient procedure. Reviewed that  recovery is usually 1-2 days. Risks including infections, bleeding, and damage to surrounding organs reviewed. Recommend NPO prior to midnight and reviewed medication to take on day of surgery. Dicussed use of NSAIDS as needed for pain postoperatively. All questions answered.  Preop checklist: Antibiotics: none DVT ppx: SCDs Postop visit: 1 week Additional clearance: none   Orders: -     Ambulatory Referral For Surgery Scheduling -     Tranexamic Acid; Take 2 tablets (1,300 mg total) by mouth 3 (three) times daily for 5 days.  Dispense: 30 tablet; Refill: 0     Rosalyn Gess, MD

## 2022-11-24 NOTE — H&P (View-Only) (Signed)
 47 y.o. G2P2 female s/p BTL with AUB (regular, HVB), aenmia here for surgical consultation for diagnostic hysteroscopy, D&C.  Patient's last menstrual period was 10/16/2022 (approximate).   Had 2 cycles in October with really HVB. No CP or SOB.  Last PAP:    Component Value Date/Time   DIAGPAP  05/02/2022 1553    - Negative for Intraepithelial Lesions or Malignancy (NILM)   DIAGPAP - Benign reactive/reparative changes 05/02/2022 1553   HPVHIGH Negative 05/02/2022 1553   ADEQPAP  05/02/2022 1553    Satisfactory for evaluation; transformation zone component PRESENT.   Birth control: BTL  GYN HISTORY: No significant history  OB History  Gravida Para Term Preterm AB Living  2 2       2   SAB IAB Ectopic Multiple Live Births               # Outcome Date GA Lbr Len/2nd Weight Sex Type Anes PTL Lv  2 Para           1 Para             Past Medical History:  Diagnosis Date   Anemia     Past Surgical History:  Procedure Laterality Date   NO PAST SURGERIES     TUBAL LIGATION      Current Outpatient Medications on File Prior to Visit  Medication Sig Dispense Refill   iron polysaccharides (NIFEREX) 150 MG capsule Take 1 capsule (150 mg total) by mouth daily. 90 capsule 0   [DISCONTINUED] albuterol (PROVENTIL HFA;VENTOLIN HFA) 108 (90 Base) MCG/ACT inhaler Inhale 2 puffs into the lungs every 4 (four) hours as needed for wheezing or shortness of breath. 1 Inhaler 0   No current facility-administered medications on file prior to visit.    No Known Allergies    PE Today's Vitals   11/24/22 0924  BP: 110/76  Pulse: 68  SpO2: 100%  Weight: 135 lb (61.2 kg)  Height: 5' 5.75" (1.67 m)   Body mass index is 21.96 kg/m.  Physical Exam Vitals reviewed.  Constitutional:      General: She is not in acute distress.    Appearance: Normal appearance.  HENT:     Head: Normocephalic and atraumatic.     Nose: Nose normal.  Eyes:     Extraocular Movements: Extraocular  movements intact.     Conjunctiva/sclera: Conjunctivae normal.  Cardiovascular:     Rate and Rhythm: Normal rate and regular rhythm.     Heart sounds: No murmur heard.    No friction rub. No gallop.  Pulmonary:     Effort: Pulmonary effort is normal. No respiratory distress.     Breath sounds: Normal breath sounds. No stridor. No wheezing, rhonchi or rales.  Musculoskeletal:        General: Normal range of motion.     Cervical back: Normal range of motion.  Neurological:     General: No focal deficit present.     Mental Status: She is alert.  Psychiatric:        Mood and Affect: Mood normal.        Behavior: Behavior normal.       Assessment and Plan:        Menorrhagia with irregular cycle Assessment & Plan: Hormonal work-up wnl US unremarkable Endometrial sampling via EMB failed x2, recommend sampling due to age. First line therapies reviewed, including hormonal therapy and tranexamic acid. Patient is interested in Mirena IUD. Pamphlets provided. Will send rx for  lysteda until procedure. Orders placed for diagnostic hysteroscopy, D&C, Mirena IUD insertion. Discussed outpatient procedure. Reviewed that  recovery is usually 1-2 days. Risks including infections, bleeding, and damage to surrounding organs reviewed. Recommend NPO prior to midnight and reviewed medication to take on day of surgery. Dicussed use of NSAIDS as needed for pain postoperatively. All questions answered.  Preop checklist: Antibiotics: none DVT ppx: SCDs Postop visit: 1 week Additional clearance: none   Orders: -     Ambulatory Referral For Surgery Scheduling -     Tranexamic Acid; Take 2 tablets (1,300 mg total) by mouth 3 (three) times daily for 5 days.  Dispense: 30 tablet; Refill: 0     Rosalyn Gess, MD

## 2022-12-03 ENCOUNTER — Encounter: Payer: Self-pay | Admitting: *Deleted

## 2022-12-08 ENCOUNTER — Encounter (HOSPITAL_BASED_OUTPATIENT_CLINIC_OR_DEPARTMENT_OTHER): Payer: Self-pay | Admitting: Obstetrics and Gynecology

## 2022-12-08 ENCOUNTER — Inpatient Hospital Stay: Payer: BC Managed Care – PPO | Attending: Oncology

## 2022-12-08 ENCOUNTER — Other Ambulatory Visit: Payer: Self-pay

## 2022-12-08 DIAGNOSIS — D509 Iron deficiency anemia, unspecified: Secondary | ICD-10-CM

## 2022-12-08 LAB — CBC WITH DIFFERENTIAL/PLATELET
Abs Immature Granulocytes: 0.02 10*3/uL (ref 0.00–0.07)
Basophils Absolute: 0 10*3/uL (ref 0.0–0.1)
Basophils Relative: 1 %
Eosinophils Absolute: 0.1 10*3/uL (ref 0.0–0.5)
Eosinophils Relative: 1 %
HCT: 35.9 % — ABNORMAL LOW (ref 36.0–46.0)
Hemoglobin: 11.4 g/dL — ABNORMAL LOW (ref 12.0–15.0)
Immature Granulocytes: 0 %
Lymphocytes Relative: 16 %
Lymphs Abs: 0.8 10*3/uL (ref 0.7–4.0)
MCH: 26.8 pg (ref 26.0–34.0)
MCHC: 31.8 g/dL (ref 30.0–36.0)
MCV: 84.3 fL (ref 80.0–100.0)
Monocytes Absolute: 0.4 10*3/uL (ref 0.1–1.0)
Monocytes Relative: 8 %
Neutro Abs: 3.8 10*3/uL (ref 1.7–7.7)
Neutrophils Relative %: 74 %
Platelets: 234 10*3/uL (ref 150–400)
RBC: 4.26 MIL/uL (ref 3.87–5.11)
RDW: 14.1 % (ref 11.5–15.5)
WBC: 5.2 10*3/uL (ref 4.0–10.5)
nRBC: 0 % (ref 0.0–0.2)

## 2022-12-08 LAB — IRON AND TIBC
Iron: 58 ug/dL (ref 28–170)
Saturation Ratios: 15 % (ref 10.4–31.8)
TIBC: 391 ug/dL (ref 250–450)
UIBC: 333 ug/dL

## 2022-12-08 LAB — FERRITIN: Ferritin: 9 ng/mL — ABNORMAL LOW (ref 11–307)

## 2022-12-08 NOTE — Progress Notes (Signed)
Spoke w/ via phone for pre-op interview--- Alexis [name] needs dos----   UPT per anesthesia      [name] results------ COVID test -----patient states asymptomatic no test needed Arrive at -------0800 NPO after MN NO Solid Food.  Clear liquids from MN until---0700 Med rec completed Medications to take morning of surgery -----NONE Diabetic medication ----- Patient instructed no nail polish to be worn day of surgery Patient instructed to bring photo id and insurance card day of surgery Patient aware to have Driver (ride ) / caregiver    for 24 hours after surgery - Boyfriend Alexis [name] Patient Special Instructions ----- Pre-Op special Instructions ----- Patient verbalized understanding of instructions that were given at this phone interview. Patient denies chest pain, sob, fever, cough at the interview.

## 2022-12-09 ENCOUNTER — Inpatient Hospital Stay (HOSPITAL_BASED_OUTPATIENT_CLINIC_OR_DEPARTMENT_OTHER): Payer: BC Managed Care – PPO | Admitting: Oncology

## 2022-12-09 ENCOUNTER — Inpatient Hospital Stay: Payer: BC Managed Care – PPO

## 2022-12-09 ENCOUNTER — Encounter: Payer: Self-pay | Admitting: Oncology

## 2022-12-09 VITALS — BP 127/59 | HR 74 | Temp 97.3°F | Resp 18 | Wt 136.0 lb

## 2022-12-09 VITALS — BP 130/79 | HR 75 | Temp 97.7°F | Resp 19

## 2022-12-09 DIAGNOSIS — D509 Iron deficiency anemia, unspecified: Secondary | ICD-10-CM | POA: Diagnosis not present

## 2022-12-09 MED ORDER — IRON SUCROSE 20 MG/ML IV SOLN
200.0000 mg | Freq: Once | INTRAVENOUS | Status: AC
  Administered 2022-12-09: 200 mg via INTRAVENOUS
  Filled 2022-12-09: qty 10

## 2022-12-09 MED ORDER — SODIUM CHLORIDE 0.9% FLUSH
10.0000 mL | Freq: Once | INTRAVENOUS | Status: AC | PRN
  Administered 2022-12-09: 10 mL
  Filled 2022-12-09: qty 10

## 2022-12-09 NOTE — Progress Notes (Signed)
Patient here today for follow up regarding anemia. Patient denies concerns.  

## 2022-12-09 NOTE — Progress Notes (Signed)
[facility]  [phone] [phone]  ID: Alexis [name] OB: [birth year]  MR#: [id number]  HKV#:[id number]  Patient Care Team: System, Provider Not In as PCP - General Chrzanowski, Lamona Curl, NP as Nurse Practitioner (Radiology) Jeralyn Ruths, MD as Consulting Physician (Oncology)  CHIEF COMPLAINT: Iron deficiency anemia.  INTERVAL HISTORY: Patient returns to clinic today for repeat laboratory work, further evaluation, and consideration of additional IV Venofer.  She currently feels well and is asymptomatic.  She does not complain of any weakness or fatigue today. She has no neurologic complaints.  She denies any recent fevers or illnesses.  She has a good appetite and denies weight loss.  She has no chest pain, shortness of breath, cough, or hemoptysis.  She denies any nausea, vomiting, constipation, or diarrhea.  She has no melena or hematochezia.  She has no urinary complaints.  Patient offers no specific complaints today.  REVIEW OF SYSTEMS:   Review of Systems  Constitutional: Negative.  Negative for fever, malaise/fatigue and weight loss.  Respiratory: Negative.  Negative for cough, hemoptysis and shortness of breath.   Cardiovascular: Negative.  Negative for chest pain and leg swelling.  Gastrointestinal: Negative.  Negative for abdominal pain, blood in stool and melena.  Genitourinary: Negative.  Negative for dysuria.  Musculoskeletal: Negative.  Negative for back pain.  Skin: Negative.  Negative for rash.  Neurological: Negative.  Negative for dizziness, focal weakness, weakness and headaches.  Psychiatric/Behavioral:  The patient is not nervous/anxious.     As per HPI. Otherwise, a complete review of systems is negative.  PAST MEDICAL HISTORY: Past Medical History:  Diagnosis Date   Anemia     PAST SURGICAL HISTORY: Past Surgical History:  Procedure Laterality Date   NO PAST SURGERIES     TUBAL LIGATION      FAMILY  HISTORY: Family History  Problem Relation Age of Onset   Hypertension Mother    Diabetes Mother    Hypertension Father    Hypertension Brother     ADVANCED DIRECTIVES (Y/N):  N  HEALTH MAINTENANCE: Social History   Tobacco Use   Smoking status: Never    Passive exposure: Never   Smokeless tobacco: Never  Vaping Use   Vaping status: Never Used  Substance Use Topics   Alcohol use: No   Drug use: Never     Colonoscopy:  PAP:  Bone density:  Lipid panel:  No Known Allergies  Current Outpatient Medications  Medication Sig Dispense Refill   iron polysaccharides (NIFEREX) 150 MG capsule Take 1 capsule (150 mg total) by mouth daily. 90 capsule 0   No current facility-administered medications for this visit.    OBJECTIVE: Vitals:   12/09/22 1304  BP: (!) 127/59  Pulse: 74  Resp: 18  Temp: (!) 97.3 F (36.3 C)  SpO2: 100%     Body mass index is 21.95 kg/m.    ECOG FS:0 - Asymptomatic  General: Well-developed, well-nourished, no acute distress. Eyes: Pink conjunctiva, anicteric sclera. HEENT: Normocephalic, moist mucous membranes. Lungs: No audible wheezing or coughing. Heart: Regular rate and rhythm. Abdomen: Soft, nontender, no obvious distention. Musculoskeletal: No edema, cyanosis, or clubbing. Neuro: Alert, answering all questions appropriately. Cranial nerves grossly intact. Skin: No rashes or petechiae noted. Psych: Normal affect.  LAB RESULTS:  Lab Results  Component Value Date   NA 137 12/13/2021   K 3.5 12/13/2021   CL 107 12/13/2021   CO2 26 12/13/2021   GLUCOSE 85 12/13/2021   BUN  9 12/13/2021   CREATININE 0.62 12/13/2021   CALCIUM 8.4 (L) 12/13/2021   PROT 7.9 12/10/2021   ALBUMIN 4.0 12/10/2021   AST 14 (L) 12/10/2021   ALT 9 12/10/2021   ALKPHOS 60 12/10/2021   BILITOT 1.0 12/10/2021   GFRNONAA >60 12/13/2021    Lab Results  Component Value Date   WBC 5.2 12/08/2022   NEUTROABS 3.8 12/08/2022   HGB 11.4 (L) 12/08/2022   HCT  35.9 (L) 12/08/2022   MCV 84.3 12/08/2022   PLT 234 12/08/2022   Lab Results  Component Value Date   IRON 58 12/08/2022   TIBC 391 12/08/2022   IRONPCTSAT 15 12/08/2022   Lab Results  Component Value Date   FERRITIN 9 (L) 12/08/2022     STUDIES: No results found.  ASSESSMENT: Iron deficiency anemia.  PLAN:    Iron deficiency anemia: Patient's hemoglobin and ferritin are slightly reduced.  Total iron and iron saturation is within normal limits.  Will proceed with 200 mg IV Venofer today.  Return to clinic 2 times over the next week to receive additional treatment.  Patient will then return to clinic in 4 months with repeat laboratory work, further evaluation, and continuation of treatment if needed.    I spent a total of 30 minutes reviewing chart data, face-to-face evaluation with the patient, counseling and coordination of care as detailed above.   Patient expressed understanding and was in agreement with this plan. She also understands that She can call clinic at any time with any questions, concerns, or complaints.    Jeralyn Ruths, MD   12/09/2022 1:49 PM

## 2022-12-15 ENCOUNTER — Inpatient Hospital Stay: Payer: BC Managed Care – PPO

## 2022-12-15 VITALS — BP 102/66 | HR 74 | Temp 97.8°F | Resp 18

## 2022-12-15 DIAGNOSIS — D509 Iron deficiency anemia, unspecified: Secondary | ICD-10-CM | POA: Diagnosis not present

## 2022-12-15 MED ORDER — SODIUM CHLORIDE 0.9% FLUSH
10.0000 mL | Freq: Once | INTRAVENOUS | Status: AC | PRN
  Administered 2022-12-15: 10 mL
  Filled 2022-12-15: qty 10

## 2022-12-15 MED ORDER — IRON SUCROSE 20 MG/ML IV SOLN
200.0000 mg | Freq: Once | INTRAVENOUS | Status: AC
  Administered 2022-12-15: 200 mg via INTRAVENOUS
  Filled 2022-12-15: qty 10

## 2022-12-15 NOTE — Anesthesia Preprocedure Evaluation (Signed)
Anesthesia Evaluation    Reviewed: Allergy & Precautions, Patient's Chart, lab work & pertinent test results  Airway Mallampati: II  TM Distance: >3 FB Neck ROM: Full    Dental no notable dental hx. (+) Teeth Intact, Dental Advisory Given   Pulmonary    Pulmonary exam normal breath sounds clear to auscultation       Cardiovascular Normal cardiovascular exam Rhythm:Regular Rate:Normal     Neuro/Psych    GI/Hepatic   Endo/Other    Renal/GU      Musculoskeletal negative musculoskeletal ROS (+)    Abdominal   Peds  Hematology Lab Results      Component                Value               Date                      WBC                      5.2                 12/08/2022                HGB                      11.4 (L)            12/08/2022                HCT                      35.9 (L)            12/08/2022                MCV                      84.3                12/08/2022                PLT                      234                 12/08/2022              Anesthesia Other Findings   Reproductive/Obstetrics negative OB ROS                              Anesthesia Physical Anesthesia Plan  ASA: 2  Anesthesia Plan: General   Post-op Pain Management: Precedex, Tylenol PO (pre-op)* and Toradol IV (intra-op)*   Induction: Intravenous  PONV Risk Score and Plan: 3 and Treatment may vary due to age or medical condition, Midazolam and Ondansetron  Airway Management Planned: Oral ETT  Additional Equipment: None  Intra-op Plan:   Post-operative Plan:   Informed Consent: I have reviewed the patients History and Physical, chart, labs and discussed the procedure including the risks, benefits and alternatives for the proposed anesthesia with the patient or authorized representative who has indicated his/her understanding and acceptance.     Dental advisory given  Plan Discussed with:  CRNA and Anesthesiologist  Anesthesia Plan Comments:  Anesthesia Quick Evaluation

## 2022-12-16 ENCOUNTER — Ambulatory Visit (HOSPITAL_BASED_OUTPATIENT_CLINIC_OR_DEPARTMENT_OTHER)
Admission: RE | Admit: 2022-12-16 | Discharge: 2022-12-16 | Disposition: A | Discharge status: Home / Self Care | Payer: BC Managed Care – PPO | Attending: Obstetrics and Gynecology | Admitting: Obstetrics and Gynecology

## 2022-12-16 ENCOUNTER — Inpatient Hospital Stay: Payer: BC Managed Care – PPO

## 2022-12-16 ENCOUNTER — Encounter (HOSPITAL_BASED_OUTPATIENT_CLINIC_OR_DEPARTMENT_OTHER)
Admission: RE | Disposition: A | Discharge status: Home / Self Care | Payer: Self-pay | Source: Home / Self Care | Attending: Obstetrics and Gynecology

## 2022-12-16 ENCOUNTER — Ambulatory Visit (HOSPITAL_BASED_OUTPATIENT_CLINIC_OR_DEPARTMENT_OTHER): Payer: Self-pay | Admitting: Anesthesiology

## 2022-12-16 ENCOUNTER — Other Ambulatory Visit: Payer: Self-pay

## 2022-12-16 ENCOUNTER — Encounter (HOSPITAL_BASED_OUTPATIENT_CLINIC_OR_DEPARTMENT_OTHER): Payer: Self-pay | Admitting: Obstetrics and Gynecology

## 2022-12-16 DIAGNOSIS — N939 Abnormal uterine and vaginal bleeding, unspecified: Secondary | ICD-10-CM | POA: Diagnosis present

## 2022-12-16 DIAGNOSIS — N921 Excessive and frequent menstruation with irregular cycle: Secondary | ICD-10-CM | POA: Diagnosis not present

## 2022-12-16 DIAGNOSIS — N840 Polyp of corpus uteri: Secondary | ICD-10-CM | POA: Insufficient documentation

## 2022-12-16 DIAGNOSIS — Z01818 Encounter for other preprocedural examination: Secondary | ICD-10-CM

## 2022-12-16 HISTORY — PX: INTRAUTERINE DEVICE (IUD) INSERTION: SHX5877

## 2022-12-16 HISTORY — PX: DILATATION & CURETTAGE/HYSTEROSCOPY WITH MYOSURE: SHX6511

## 2022-12-16 LAB — POCT PREGNANCY, URINE: Preg Test, Ur: NEGATIVE

## 2022-12-16 SURGERY — DILATATION & CURETTAGE/HYSTEROSCOPY WITH MYOSURE
Anesthesia: General | Site: Vagina

## 2022-12-16 MED ORDER — HYDROMORPHONE HCL 1 MG/ML IJ SOLN: 0.2500 mg | INTRAMUSCULAR | Status: DC | PRN

## 2022-12-16 MED ORDER — CELECOXIB 200 MG PO CAPS
ORAL_CAPSULE | ORAL | Status: AC
  Filled 2022-12-16: qty 2

## 2022-12-16 MED ORDER — ACETAMINOPHEN 500 MG PO TABS
1000.0000 mg | ORAL_TABLET | ORAL | Status: AC
  Administered 2022-12-16: 1000 mg via ORAL

## 2022-12-16 MED ORDER — LIDOCAINE HCL (PF) 1 % IJ SOLN
INTRAMUSCULAR | Status: DC | PRN
  Administered 2022-12-16: 10 mL

## 2022-12-16 MED ORDER — SODIUM CHLORIDE 0.9 % IV SOLN: INTRAVENOUS | Status: DC

## 2022-12-16 MED ORDER — IBUPROFEN 800 MG PO TABS: 800.0000 mg | ORAL_TABLET | Freq: Three times a day (TID) | ORAL | 0 refills | Status: AC | PRN

## 2022-12-16 MED ORDER — LEVONORGESTREL 20 MCG/DAY IU IUD
1.0000 | INTRAUTERINE_SYSTEM | INTRAUTERINE | Status: AC
Start: 2022-12-16 — End: 2022-12-16
  Administered 2022-12-16: 1 via INTRAUTERINE

## 2022-12-16 MED ORDER — FENTANYL CITRATE (PF) 100 MCG/2ML IJ SOLN
INTRAMUSCULAR | Status: DC | PRN
  Administered 2022-12-16: 50 ug via INTRAVENOUS

## 2022-12-16 MED ORDER — LIDOCAINE HCL (PF) 2 % IJ SOLN
INTRAMUSCULAR | Status: AC
  Filled 2022-12-16: qty 5

## 2022-12-16 MED ORDER — PROPOFOL 10 MG/ML IV BOLUS
INTRAVENOUS | Status: AC
  Filled 2022-12-16: qty 20

## 2022-12-16 MED ORDER — ACETAMINOPHEN 500 MG PO TABS
ORAL_TABLET | ORAL | Status: AC
  Filled 2022-12-16: qty 2

## 2022-12-16 MED ORDER — KETOROLAC TROMETHAMINE 30 MG/ML IJ SOLN: 30.0000 mg | Freq: Once | INTRAMUSCULAR | Status: DC | PRN

## 2022-12-16 MED ORDER — PROPOFOL 10 MG/ML IV BOLUS
INTRAVENOUS | Status: DC | PRN
  Administered 2022-12-16: 150 mg via INTRAVENOUS

## 2022-12-16 MED ORDER — ONDANSETRON HCL 4 MG/2ML IJ SOLN
INTRAMUSCULAR | Status: DC | PRN
  Administered 2022-12-16: 4 mg via INTRAVENOUS

## 2022-12-16 MED ORDER — DEXAMETHASONE SODIUM PHOSPHATE 10 MG/ML IJ SOLN
INTRAMUSCULAR | Status: AC
  Filled 2022-12-16: qty 1

## 2022-12-16 MED ORDER — DEXMEDETOMIDINE HCL IN NACL 80 MCG/20ML IV SOLN
INTRAVENOUS | Status: DC | PRN
  Administered 2022-12-16: 8 ug via INTRAVENOUS

## 2022-12-16 MED ORDER — OXYCODONE HCL 5 MG/5ML PO SOLN: 5.0000 mg | Freq: Once | ORAL | Status: DC | PRN

## 2022-12-16 MED ORDER — DEXAMETHASONE SODIUM PHOSPHATE 10 MG/ML IJ SOLN
INTRAMUSCULAR | Status: DC | PRN
  Administered 2022-12-16: 10 mg via INTRAVENOUS

## 2022-12-16 MED ORDER — SILVER NITRATE-POT NITRATE 75-25 % EX MISC
CUTANEOUS | Status: DC | PRN
  Administered 2022-12-16: 2

## 2022-12-16 MED ORDER — MIDAZOLAM HCL 5 MG/5ML IJ SOLN
INTRAMUSCULAR | Status: DC | PRN
  Administered 2022-12-16: 2 mg via INTRAVENOUS

## 2022-12-16 MED ORDER — CELECOXIB 200 MG PO CAPS
400.0000 mg | ORAL_CAPSULE | ORAL | Status: AC
  Administered 2022-12-16: 400 mg via ORAL

## 2022-12-16 MED ORDER — ONDANSETRON HCL 4 MG/2ML IJ SOLN: 4.0000 mg | Freq: Once | INTRAMUSCULAR | Status: DC | PRN

## 2022-12-16 MED ORDER — OXYCODONE HCL 5 MG PO TABS: 5.0000 mg | ORAL_TABLET | Freq: Once | ORAL | Status: DC | PRN

## 2022-12-16 MED ORDER — SODIUM CHLORIDE 0.9 % IR SOLN
Status: DC | PRN
  Administered 2022-12-16: 1000 mL

## 2022-12-16 MED ORDER — FLUMAZENIL 0.5 MG/5ML IV SOLN
INTRAVENOUS | Status: DC | PRN
  Administered 2022-12-16: .3 mg via INTRAVENOUS

## 2022-12-16 MED ORDER — MIDAZOLAM HCL 2 MG/2ML IJ SOLN
INTRAMUSCULAR | Status: AC
  Filled 2022-12-16: qty 2

## 2022-12-16 MED ORDER — LIDOCAINE 2% (20 MG/ML) 5 ML SYRINGE
INTRAMUSCULAR | Status: DC | PRN
  Administered 2022-12-16: 20 mg via INTRAVENOUS

## 2022-12-16 MED ORDER — ONDANSETRON HCL 4 MG/2ML IJ SOLN
INTRAMUSCULAR | Status: AC
  Filled 2022-12-16: qty 2

## 2022-12-16 MED ORDER — FENTANYL CITRATE (PF) 100 MCG/2ML IJ SOLN
INTRAMUSCULAR | Status: AC
  Filled 2022-12-16: qty 2

## 2022-12-16 MED ORDER — PHENYLEPHRINE 80 MCG/ML (10ML) SYRINGE FOR IV PUSH (FOR BLOOD PRESSURE SUPPORT)
PREFILLED_SYRINGE | INTRAVENOUS | Status: DC | PRN
  Administered 2022-12-16: 160 ug via INTRAVENOUS
  Administered 2022-12-16: 80 ug via INTRAVENOUS

## 2022-12-16 MED ORDER — LACTATED RINGERS IV SOLN
INTRAVENOUS | Status: AC
Start: 2022-12-16 — End: 2022-12-16

## 2022-12-16 SURGICAL SUPPLY — 17 items
DEVICE MYOSURE LITE (MISCELLANEOUS) IMPLANT
DEVICE MYOSURE REACH (MISCELLANEOUS) IMPLANT
DILATOR CANAL MILEX (MISCELLANEOUS) IMPLANT
GAUZE 4X4 16PLY ~~LOC~~+RFID DBL (SPONGE) IMPLANT
GLOVE BIO SURGEON STRL SZ7.5 (GLOVE) ×1 IMPLANT
GLOVE BIOGEL PI IND STRL 7.5 (GLOVE) ×1 IMPLANT
GOWN STRL REUS W/TWL XL LVL3 (GOWN DISPOSABLE) ×1 IMPLANT
IV NS IRRIG 3000ML ARTHROMATIC (IV SOLUTION) ×1 IMPLANT
KIT PROCEDURE FLUENT (KITS) ×1 IMPLANT
KIT TURNOVER CYSTO (KITS) ×1 IMPLANT
MYOSURE XL FIBROID (MISCELLANEOUS) IMPLANT
PACK VAGINAL MINOR WOMEN LF (CUSTOM PROCEDURE TRAY) ×1 IMPLANT
PAD OB MATERNITY 4.3X12.25 (PERSONAL CARE ITEMS) ×1 IMPLANT
PAD PREP 24X48 CUFFED NSTRL (MISCELLANEOUS) ×1 IMPLANT
SEAL ROD LENS SCOPE MYOSURE (ABLATOR) ×1 IMPLANT
SYSTEM TISS REMOVAL MYOSURE XL (MISCELLANEOUS) IMPLANT
TOWEL OR 17X24 6PK STRL BLUE (TOWEL DISPOSABLE) ×1 IMPLANT

## 2022-12-16 NOTE — Anesthesia Procedure Notes (Signed)
Procedure Name: LMA Insertion Date/Time: 12/16/2022 10:17 AM  Performed by: Bishop Limbo, CRNAPre-anesthesia Checklist: Patient identified, Emergency Drugs available, Suction available and Patient being monitored Patient Re-evaluated:Patient Re-evaluated prior to induction Oxygen Delivery Method: Circle System Utilized Preoxygenation: Pre-oxygenation with 100% oxygen Induction Type: IV induction Ventilation: Mask ventilation without difficulty LMA: LMA inserted LMA Size: 4.0 Number of attempts: 1 Placement Confirmation: positive ETCO2 Tube secured with: Tape Dental Injury: Teeth and Oropharynx as per pre-operative assessment

## 2022-12-16 NOTE — Transfer of Care (Signed)
Immediate Anesthesia Transfer of Care Note  Patient: Alexis [name]  Procedure(s) Performed: DILATATION & CURETTAGE/HYSTEROSCOPY WITH MYOSURE (Vagina ) Mirena INTRAUTERINE DEVICE (IUD) INSERTION (Vagina )  Patient Location: PACU  Anesthesia Type:General  Level of Consciousness: awake, alert , oriented, and patient cooperative  Airway & Oxygen Therapy: Patient Spontanous Breathing  Post-op Assessment: Report given to RN and Post -op Vital signs reviewed and stable  Post vital signs: Reviewed and stable  Last Vitals:  Vitals Value Taken Time  BP 116/75 12/16/22 1100  Temp    Pulse 73 12/16/22 1102  Resp 15 12/16/22 1102  SpO2 99 % 12/16/22 1102  Vitals shown include unfiled device data.  Last Pain:  Vitals:   12/16/22 0808  TempSrc: Oral         Complications: No notable events documented.

## 2022-12-16 NOTE — Anesthesia Postprocedure Evaluation (Signed)
Anesthesia Post Note  Patient: Alexis [name]  Procedure(s) Performed: DILATATION & CURETTAGE/HYSTEROSCOPY WITH MYOSURE (Vagina ) Mirena INTRAUTERINE DEVICE (IUD) INSERTION (Vagina )     Patient location during evaluation: PACU Anesthesia Type: General Level of consciousness: awake and alert Pain management: pain level controlled Vital Signs Assessment: post-procedure vital signs reviewed and stable Respiratory status: spontaneous breathing, nonlabored ventilation, respiratory function stable and patient connected to nasal cannula oxygen Cardiovascular status: blood pressure returned to baseline and stable Postop Assessment: no apparent nausea or vomiting Anesthetic complications: no   No notable events documented.  Last Vitals:  Vitals:   12/16/22 1130 12/16/22 1215  BP: 112/66 113/64  Pulse: 71 70  Resp: 15 16  Temp:  (!) 36.3 C  SpO2: 98% 99%    Last Pain:  Vitals:   12/16/22 1215  TempSrc:   PainSc: 0-No pain                 Trevor Iha

## 2022-12-16 NOTE — Telephone Encounter (Signed)
Please advise on RTW date and if any restrictions.

## 2022-12-16 NOTE — Discharge Instructions (Signed)
   No ibuprofen, Advil, Aleve, Motrin, ketorolac, meloxicam, naproxen, or other NSAIDS until after 2:15 pm today if needed. No acetaminophen/Tylenol until after 2:15 pm today if needed.   Post Anesthesia Home Care Instructions  Activity: Get plenty of rest for the remainder of the day. A responsible individual must stay with you for 24 hours following the procedure.  For the next 24 hours, DO NOT: -Drive a car -Advertising copywriter -Drink alcoholic beverages -Take any medication unless instructed by your physician -Make any legal decisions or sign important papers.  Meals: Start with liquid foods such as gelatin or soup. Progress to regular foods as tolerated. Avoid greasy, spicy, heavy foods. If nausea and/or vomiting occur, drink only clear liquids until the nausea and/or vomiting subsides. Call your physician if vomiting continues.  Special Instructions/Symptoms: Your throat may feel dry or sore from the anesthesia or the breathing tube placed in your throat during surgery. If this causes discomfort, gargle with warm salt water. The discomfort should disappear within 24 hours.

## 2022-12-16 NOTE — Interval H&P Note (Signed)
History and Physical Interval Note:  12/16/2022 10:01 AM  Alexis [name]  has presented today for surgery, with the diagnosis of Menorrhagia with irregular cycle.  The various methods of treatment have been discussed with the patient and family. After consideration of risks, benefits and other options for treatment, the patient has consented to  Procedure(s): DILATATION & CURETTAGE/HYSTEROSCOPY WITH MYOSURE (N/A) Mirena INTRAUTERINE DEVICE (IUD) INSERTION (N/A) as a surgical intervention.  The patient's history has been reviewed, patient examined, no change in status, stable for surgery.  I have reviewed the patient's chart and labs.  Questions were answered to the patient's satisfaction.     Julion Gatt Lasandra Beech

## 2022-12-16 NOTE — Op Note (Signed)
12/16/2022 Dede Query [id number]   OPERATIVE REPORT  Preop Diagnosis: Abnormal uterine bleeding  Post operative diagnosis: same  Procedure: Diagnostic hysteroscopy, dilation and curettage, endometrial biopsy with Jackquline Denmark, Mirena IUD insertion  Surgeon: Rosalyn Gess, MD Assistant: none  Anesthesia: MAC Fluids: please see anesthesia report Fluid deficit: 130cc  Complications: None  Findings: Normal cervix. Uterine cavity measuring 8cm with both ostia seen. Polypoid tissue of anterior endometrium.  Estimated blood loss: Minimal  Specimens:  ID Type Source Tests Collected by Time Destination  1 : endometrial currettings Tissue PATH Gyn benign resection SURGICAL PATHOLOGY Rosalyn Gess, MD 12/16/2022 1034   2 : endometrial biopsy Tissue PATH Gyn benign resection SURGICAL PATHOLOGY Rosalyn Gess, MD 12/16/2022 1044     Disposition of specimen: Pathology    Procedure: Patient was taken to the OR where she was placed in dorsal lithotomy in stirrups. She voided prior to transport. SCDs were in place.  The patient was prepped and draped in the usual sterile fashion. An adequate timeout was obtained and everyone agreed. A bivalve speculum was placed inside the vagina and the cervix visualized. The cervix was grasped anteriorly with a single-tooth tenaculum. Paracervical block was performed with 1% lidocaine. The uterus sounded to 8 cm. Sequential cervical dilation was done to 47fr, and the myosure hysteroscope was introduced into the uterine cavity. The cervix and endometrial lining appeared normal both ostia were seen. Findings as noted. Polypoid tissue was removed using the myosure. A sharp curettage was then performed to ensure complete sampling of the cavity and was sent to pathology.   The Mirena IUD was then inserted according to manufacturer's guidelines. All instrument, sponge and lap counts were correct x2. The patient was awakened taken to recovery room in stable  condition.  Rosalyn Gess MD 12/16/2022 10:57 AM

## 2022-12-17 ENCOUNTER — Encounter (HOSPITAL_BASED_OUTPATIENT_CLINIC_OR_DEPARTMENT_OTHER): Payer: Self-pay | Admitting: Obstetrics and Gynecology

## 2022-12-17 LAB — SURGICAL PATHOLOGY

## 2022-12-18 ENCOUNTER — Inpatient Hospital Stay: Payer: BC Managed Care – PPO

## 2022-12-18 VITALS — BP 97/49 | HR 68 | Temp 98.0°F | Resp 18

## 2022-12-18 DIAGNOSIS — D509 Iron deficiency anemia, unspecified: Secondary | ICD-10-CM | POA: Diagnosis not present

## 2022-12-18 MED ORDER — IRON SUCROSE 20 MG/ML IV SOLN
200.0000 mg | Freq: Once | INTRAVENOUS | Status: AC
  Administered 2022-12-18: 200 mg via INTRAVENOUS
  Filled 2022-12-18: qty 10

## 2022-12-18 MED ORDER — SODIUM CHLORIDE 0.9% FLUSH
10.0000 mL | Freq: Once | INTRAVENOUS | Status: AC | PRN
  Administered 2022-12-18: 10 mL
  Filled 2022-12-18: qty 10

## 2022-12-23 ENCOUNTER — Encounter: Payer: Self-pay | Admitting: Obstetrics and Gynecology

## 2022-12-23 ENCOUNTER — Ambulatory Visit (INDEPENDENT_AMBULATORY_CARE_PROVIDER_SITE_OTHER): Payer: BC Managed Care – PPO | Admitting: Obstetrics and Gynecology

## 2022-12-23 VITALS — BP 108/62 | HR 76 | Wt 132.0 lb

## 2022-12-23 DIAGNOSIS — N921 Excessive and frequent menstruation with irregular cycle: Secondary | ICD-10-CM

## 2022-12-23 NOTE — Assessment & Plan Note (Addendum)
S/p diagnostic hysteroscopy, D&C, Mirena IUD insertion. Benign path Appropriate exam today Continue ibuprofen as needed for pelvic cramping RTO in 6 wk for string check

## 2022-12-23 NOTE — Progress Notes (Signed)
47 y.o. G2P2 female s/p BTL with AUB, anemia, now s/p diagnostic hysteroscopy, D&C, Mirena IUD placement who presents for postop visit.  Patient's last menstrual period was 10/09/2022 (approximate).   Returned to work yesterday. Pt still taking ibuprofen for intermittent pain. Pt is having spotting starting last night.  No N/V. Normal diet, BM, and voids.  Reviewed pathology.  Last PAP:    Component Value Date/Time   DIAGPAP  05/02/2022 1553    - Negative for Intraepithelial Lesions or Malignancy (NILM)   DIAGPAP - Benign reactive/reparative changes 05/02/2022 1553   HPVHIGH Negative 05/02/2022 1553   ADEQPAP  05/02/2022 1553    Satisfactory for evaluation; transformation zone component PRESENT.   Birth control: BTL  GYN HISTORY: No significant history  OB History  Gravida Para Term Preterm AB Living  2 2    2   SAB IAB Ectopic Multiple Live Births          # Outcome Date GA Lbr Len/2nd Weight Sex Type Anes PTL Lv  2 Para           1 Para             Past Medical History:  Diagnosis Date   Anemia     Past Surgical History:  Procedure Laterality Date   DILATATION & CURETTAGE/HYSTEROSCOPY WITH MYOSURE N/A 12/16/2022   Procedure: DILATATION & CURETTAGE/HYSTEROSCOPY WITH MYOSURE;  Surgeon: Rosalyn Gess, MD;  Location: [facility];  Service: Gynecology;  Laterality: N/A;   INTRAUTERINE DEVICE (IUD) INSERTION N/A 12/16/2022   Procedure: Mirena INTRAUTERINE DEVICE (IUD) INSERTION;  Surgeon: Rosalyn Gess, MD;  Location: [facility];  Service: Gynecology;  Laterality: N/A;   NO PAST SURGERIES     TUBAL LIGATION      Current Outpatient Medications on File Prior to Visit  Medication Sig Dispense Refill   ibuprofen (ADVIL) 800 MG tablet Take 1 tablet (800 mg total) by mouth every 8 (eight) hours as needed for up to 42 doses. 42 tablet 0   levonorgestrel (MIRENA) 20 MCG/DAY IUD 1 each by Intrauterine route once.     iron  polysaccharides (NIFEREX) 150 MG capsule Take 1 capsule (150 mg total) by mouth daily. 90 capsule 0   [DISCONTINUED] albuterol (PROVENTIL HFA;VENTOLIN HFA) 108 (90 Base) MCG/ACT inhaler Inhale 2 puffs into the lungs every 4 (four) hours as needed for wheezing or shortness of breath. 1 Inhaler 0   No current facility-administered medications on file prior to visit.    No Known Allergies    PE Today's Vitals   12/23/22 1553  BP: 108/62  Pulse: 76  SpO2: 99%  Weight: 132 lb (59.9 kg)   Body mass index is 20.67 kg/m.  Physical Exam Vitals reviewed.  Constitutional:      General: She is not in acute distress.    Appearance: Normal appearance.  HENT:     Head: Normocephalic and atraumatic.     Nose: Nose normal.  Eyes:     Extraocular Movements: Extraocular movements intact.     Conjunctiva/sclera: Conjunctivae normal.  Pulmonary:     Effort: Pulmonary effort is normal.  Abdominal:     General: There is no distension.     Palpations: Abdomen is soft.     Tenderness: There is no abdominal tenderness.  Musculoskeletal:        General: Normal range of motion.     Cervical back: Normal range of motion.  Neurological:     General: No  focal deficit present.     Mental Status: She is alert.  Psychiatric:        Mood and Affect: Mood normal.        Behavior: Behavior normal.      Assessment and Plan:        Menorrhagia with irregular cycle Assessment & Plan: S/p diagnostic hysteroscopy, D&C, Mirena IUD insertion. Benign path Appropriate exam today Continue ibuprofen as needed for pelvic cramping RTO in 6 wk for string check    Rosalyn Gess, MD

## 2023-02-16 ENCOUNTER — Encounter: Payer: Self-pay | Admitting: Obstetrics and Gynecology

## 2023-02-16 ENCOUNTER — Ambulatory Visit (INDEPENDENT_AMBULATORY_CARE_PROVIDER_SITE_OTHER): Payer: BC Managed Care – PPO | Admitting: Obstetrics and Gynecology

## 2023-02-16 VITALS — BP 140/64 | HR 75 | Temp 98.1°F | Wt 133.0 lb

## 2023-02-16 DIAGNOSIS — Z975 Presence of (intrauterine) contraceptive device: Secondary | ICD-10-CM | POA: Diagnosis not present

## 2023-02-16 NOTE — Progress Notes (Signed)
 48 y.o. G2P2 female s/p BTL with AUB, anemia, now s/p diagnostic hysteroscopy, D&C, Mirena  IUD placement on 12/16/2022 who presents for string check.   No complaint. She is doing well with IUD. Last cycle was light and she denies cramping. Patient's last menstrual period was 01/09/2023 (approximate).   Last PAP:    Component Value Date/Time   DIAGPAP  05/02/2022 1553    - Negative for Intraepithelial Lesions or Malignancy (NILM)   DIAGPAP - Benign reactive/reparative changes 05/02/2022 1553   HPVHIGH Negative 05/02/2022 1553   ADEQPAP  05/02/2022 1553    Satisfactory for evaluation; transformation zone component PRESENT.   Birth control: BTL, Mirena   GYN HISTORY: No significant history  OB History  Gravida Para Term Preterm AB Living  2 2    2   SAB IAB Ectopic Multiple Live Births          # Outcome Date GA Lbr Len/2nd Weight Sex Type Anes PTL Lv  2 Para           1 Para             Past Medical History:  Diagnosis Date   Anemia    Influenza A 12/10/2021    Past Surgical History:  Procedure Laterality Date   DILATATION & CURETTAGE/HYSTEROSCOPY WITH MYOSURE N/A 12/16/2022   Procedure: DILATATION & CURETTAGE/HYSTEROSCOPY WITH MYOSURE;  Surgeon: Romaine Closs, MD;  Location: [facility];  Service: Gynecology;  Laterality: N/A;   INTRAUTERINE DEVICE (IUD) INSERTION N/A 12/16/2022   Procedure: Mirena  INTRAUTERINE DEVICE (IUD) INSERTION;  Surgeon: Romaine Closs, MD;  Location: [facility];  Service: Gynecology;  Laterality: N/A;   NO PAST SURGERIES     TUBAL LIGATION      Current Outpatient Medications on File Prior to Visit  Medication Sig Dispense Refill   ibuprofen  (ADVIL ) 800 MG tablet Take 1 tablet (800 mg total) by mouth every 8 (eight) hours as needed for up to 42 doses. 42 tablet 0   levonorgestrel  (MIRENA ) 20 MCG/DAY IUD 1 each by Intrauterine route once.     iron  polysaccharides (NIFEREX) 150 MG capsule Take 1 capsule (150  mg total) by mouth daily. 90 capsule 0   [DISCONTINUED] albuterol  (PROVENTIL  HFA;VENTOLIN  HFA) 108 (90 Base) MCG/ACT inhaler Inhale 2 puffs into the lungs every 4 (four) hours as needed for wheezing or shortness of breath. 1 Inhaler 0   No current facility-administered medications on file prior to visit.    No Known Allergies    PE Today's Vitals   02/16/23 1022  BP: (!) 140/64  Pulse: 75  Temp: 98.1 F (36.7 C)  SpO2: 98%  Weight: 133 lb (60.3 kg)   Body mass index is 20.83 kg/m.  Physical Exam Vitals reviewed. Exam conducted with a chaperone present.  Constitutional:      General: She is not in acute distress.    Appearance: Normal appearance.  HENT:     Head: Normocephalic and atraumatic.     Nose: Nose normal.  Eyes:     Extraocular Movements: Extraocular movements intact.     Conjunctiva/sclera: Conjunctivae normal.  Pulmonary:     Effort: Pulmonary effort is normal.  Abdominal:     General: There is no distension.     Palpations: Abdomen is soft.     Tenderness: There is no abdominal tenderness.  Genitourinary:    General: Normal vulva.     Exam position: Lithotomy position.     Vagina: Normal. No vaginal  discharge.     Cervix: Normal. No cervical motion tenderness, discharge or lesion.     Uterus: Normal. Not enlarged and not tender.      Adnexa: Right adnexa normal and left adnexa normal.     Comments: IUD strings present. Musculoskeletal:        General: Normal range of motion.     Cervical back: Normal range of motion.  Neurological:     General: No focal deficit present.     Mental Status: She is alert.  Psychiatric:        Mood and Affect: Mood normal.        Behavior: Behavior normal.      Assessment and Plan:        Uses hormone releasing intrauterine device (IUD) for contraception  Normal string check, return office for annual exam in April 2025. Romaine Closs, MD

## 2023-04-08 ENCOUNTER — Inpatient Hospital Stay: Payer: BC Managed Care – PPO | Attending: Oncology

## 2023-04-08 DIAGNOSIS — D509 Iron deficiency anemia, unspecified: Secondary | ICD-10-CM | POA: Diagnosis present

## 2023-04-08 LAB — CBC WITH DIFFERENTIAL (CANCER CENTER ONLY)
Abs Immature Granulocytes: 0.03 10*3/uL (ref 0.00–0.07)
Basophils Absolute: 0 10*3/uL (ref 0.0–0.1)
Basophils Relative: 1 %
Eosinophils Absolute: 0 10*3/uL (ref 0.0–0.5)
Eosinophils Relative: 1 %
HCT: 38 % (ref 36.0–46.0)
Hemoglobin: 12.4 g/dL (ref 12.0–15.0)
Immature Granulocytes: 1 %
Lymphocytes Relative: 14 %
Lymphs Abs: 0.9 10*3/uL (ref 0.7–4.0)
MCH: 27.5 pg (ref 26.0–34.0)
MCHC: 32.6 g/dL (ref 30.0–36.0)
MCV: 84.3 fL (ref 80.0–100.0)
Monocytes Absolute: 0.5 10*3/uL (ref 0.1–1.0)
Monocytes Relative: 8 %
Neutro Abs: 4.9 10*3/uL (ref 1.7–7.7)
Neutrophils Relative %: 75 %
Platelet Count: 242 10*3/uL (ref 150–400)
RBC: 4.51 MIL/uL (ref 3.87–5.11)
RDW: 12.8 % (ref 11.5–15.5)
WBC Count: 6.3 10*3/uL (ref 4.0–10.5)
nRBC: 0 % (ref 0.0–0.2)

## 2023-04-08 LAB — IRON AND TIBC
Iron: 111 ug/dL (ref 28–170)
Saturation Ratios: 39 % — ABNORMAL HIGH (ref 10.4–31.8)
TIBC: 287 ug/dL (ref 250–450)
UIBC: 176 ug/dL

## 2023-04-08 LAB — FERRITIN: Ferritin: 64 ng/mL (ref 11–307)

## 2023-04-09 ENCOUNTER — Inpatient Hospital Stay: Payer: BC Managed Care – PPO

## 2023-04-09 ENCOUNTER — Inpatient Hospital Stay (HOSPITAL_BASED_OUTPATIENT_CLINIC_OR_DEPARTMENT_OTHER): Payer: BC Managed Care – PPO | Admitting: Oncology

## 2023-04-09 VITALS — BP 117/75 | HR 82 | Temp 98.2°F | Resp 16 | Ht 67.0 in | Wt 129.0 lb

## 2023-04-09 DIAGNOSIS — D509 Iron deficiency anemia, unspecified: Secondary | ICD-10-CM

## 2023-04-09 NOTE — Progress Notes (Signed)
 [facility]  [phone] [phone]  ID: Alexis [name] OB: 29-Oct-1975  MR#: [id number]  AOZ#:[id number]  Patient Care Team: System, Provider Not In as PCP - General Chrzanowski, Lamona Curl, NP as Nurse Practitioner (Radiology) Jeralyn Ruths, MD as Consulting Physician (Oncology)  CHIEF COMPLAINT: Iron deficiency anemia.  INTERVAL HISTORY: Patient returns to clinic today for repeat laboratory work, further evaluation, and consideration of additional IV Venofer.  She continues to feel well and remains asymptomatic.  She does not complain of any weakness or fatigue today.  She has no neurologic complaints.  She denies any recent fevers or illnesses.  She has a good appetite and denies weight loss.  She has no chest pain, shortness of breath, cough, or hemoptysis.  She denies any nausea, vomiting, constipation, or diarrhea.  She has no melena or hematochezia.  She has no urinary complaints.  Patient offers no specific complaints today.  REVIEW OF SYSTEMS:   Review of Systems  Constitutional: Negative.  Negative for fever, malaise/fatigue and weight loss.  Respiratory: Negative.  Negative for cough, hemoptysis and shortness of breath.   Cardiovascular: Negative.  Negative for chest pain and leg swelling.  Gastrointestinal: Negative.  Negative for abdominal pain, blood in stool and melena.  Genitourinary: Negative.  Negative for dysuria.  Musculoskeletal: Negative.  Negative for back pain.  Skin: Negative.  Negative for rash.  Neurological: Negative.  Negative for dizziness, focal weakness, weakness and headaches.  Psychiatric/Behavioral:  The patient is not nervous/anxious.     As per HPI. Otherwise, a complete review of systems is negative.  PAST MEDICAL HISTORY: Past Medical History:  Diagnosis Date   Anemia    Influenza A 12/10/2021    PAST SURGICAL HISTORY: Past Surgical History:  Procedure Laterality Date   DILATATION &  CURETTAGE/HYSTEROSCOPY WITH MYOSURE N/A 12/16/2022   Procedure: DILATATION & CURETTAGE/HYSTEROSCOPY WITH MYOSURE;  Surgeon: Rosalyn Gess, MD;  Location: [facility] [place];  Service: Gynecology;  Laterality: N/A;   INTRAUTERINE DEVICE (IUD) INSERTION N/A 12/16/2022   Procedure: Mirena INTRAUTERINE DEVICE (IUD) INSERTION;  Surgeon: Rosalyn Gess, MD;  Location: [facility];  Service: Gynecology;  Laterality: N/A;   NO PAST SURGERIES     TUBAL LIGATION      FAMILY HISTORY: Family History  Problem Relation Age of Onset   Hypertension Mother    Diabetes Mother    Hypertension Father    Hypertension Brother     ADVANCED DIRECTIVES (Y/N):  N  HEALTH MAINTENANCE: Social History   Tobacco Use   Smoking status: Never    Passive exposure: Never   Smokeless tobacco: Never  Vaping Use   Vaping status: Never Used  Substance Use Topics   Alcohol use: No   Drug use: Never     Colonoscopy:  PAP:  Bone density:  Lipid panel:  No Known Allergies  Current Outpatient Medications  Medication Sig Dispense Refill   ibuprofen (ADVIL) 800 MG tablet Take 1 tablet (800 mg total) by mouth every 8 (eight) hours as needed for up to 42 doses. 42 tablet 0   iron polysaccharides (NIFEREX) 150 MG capsule Take 1 capsule (150 mg total) by mouth daily. 90 capsule 0   levonorgestrel (MIRENA) 20 MCG/DAY IUD 1 each by Intrauterine route once.     No current facility-administered medications for this visit.    OBJECTIVE: Vitals:   04/09/23 1434  BP: 117/75  Pulse: 82  Resp: 16  Temp: 98.2 F (36.8  C)  SpO2: 99%     Body mass index is 20.2 kg/m.    ECOG FS:0 - Asymptomatic  General: Well-developed, well-nourished, no acute distress. Eyes: Pink conjunctiva, anicteric sclera. HEENT: Normocephalic, moist mucous membranes. Lungs: No audible wheezing or coughing. Heart: Regular rate and rhythm. Abdomen: Soft, nontender, no obvious distention. Musculoskeletal: No  edema, cyanosis, or clubbing. Neuro: Alert, answering all questions appropriately. Cranial nerves grossly intact. Skin: No rashes or petechiae noted. Psych: Normal affect.  LAB RESULTS:  Lab Results  Component Value Date   NA 137 12/13/2021   K 3.5 12/13/2021   CL 107 12/13/2021   CO2 26 12/13/2021   GLUCOSE 85 12/13/2021   BUN 9 12/13/2021   CREATININE 0.62 12/13/2021   CALCIUM 8.4 (L) 12/13/2021   PROT 7.9 12/10/2021   ALBUMIN 4.0 12/10/2021   AST 14 (L) 12/10/2021   ALT 9 12/10/2021   ALKPHOS 60 12/10/2021   BILITOT 1.0 12/10/2021   GFRNONAA >60 12/13/2021    Lab Results  Component Value Date   WBC 6.3 04/08/2023   NEUTROABS 4.9 04/08/2023   HGB 12.4 04/08/2023   HCT 38.0 04/08/2023   MCV 84.3 04/08/2023   PLT 242 04/08/2023   Lab Results  Component Value Date   IRON 111 04/08/2023   TIBC 287 04/08/2023   IRONPCTSAT 39 (H) 04/08/2023   Lab Results  Component Value Date   FERRITIN 64 04/08/2023     STUDIES: No results found.  ASSESSMENT: Iron deficiency anemia.  PLAN:    Iron deficiency anemia: Patient's hemoglobin and iron stores are now within normal limits.  She does not require additional IV Venofer today.  Patient last received treatment on December 18, 2022.  No intervention is needed.  Return to clinic in 4 months with repeat laboratory work, further evaluation, and continuation of treatment if needed.    I spent a total of 20 minutes reviewing chart data, face-to-face evaluation with the patient, counseling and coordination of care as detailed above.    Patient expressed understanding and was in agreement with this plan. She also understands that She can call clinic at any time with any questions, concerns, or complaints.    Jeralyn Ruths, MD   04/11/2023 10:02 AM

## 2023-04-09 NOTE — Progress Notes (Signed)
 Needs refill of iron pills.

## 2023-04-11 ENCOUNTER — Encounter: Payer: Self-pay | Admitting: Oncology

## 2023-05-01 ENCOUNTER — Ambulatory Visit: Payer: BC Managed Care – PPO | Admitting: Radiology

## 2023-05-05 ENCOUNTER — Encounter: Payer: Self-pay | Admitting: Radiology

## 2023-05-05 ENCOUNTER — Ambulatory Visit (INDEPENDENT_AMBULATORY_CARE_PROVIDER_SITE_OTHER): Payer: BC Managed Care – PPO | Admitting: Radiology

## 2023-05-05 VITALS — BP 114/66 | HR 88 | Ht 65.5 in | Wt 128.0 lb

## 2023-05-05 DIAGNOSIS — Z975 Presence of (intrauterine) contraceptive device: Secondary | ICD-10-CM

## 2023-05-05 DIAGNOSIS — Z1331 Encounter for screening for depression: Secondary | ICD-10-CM | POA: Diagnosis not present

## 2023-05-05 DIAGNOSIS — Z01419 Encounter for gynecological examination (general) (routine) without abnormal findings: Secondary | ICD-10-CM

## 2023-05-05 DIAGNOSIS — Z1211 Encounter for screening for malignant neoplasm of colon: Secondary | ICD-10-CM

## 2023-05-05 NOTE — Patient Instructions (Signed)
 Preventive Care 16-46 Years Old, Female  Preventive care refers to lifestyle choices and visits with your health care provider that can promote health and wellness. Preventive care visits are also called wellness exams.  What can I expect for my preventive care visit?  Counseling  Your health care provider may ask you questions about your:  Medical history, including:  Past medical problems.  Family medical history.  Pregnancy history.  Current health, including:  Menstrual cycle.  Method of birth control.  Emotional well-being.  Home life and relationship well-being.  Sexual activity and sexual health.  Lifestyle, including:  Alcohol, nicotine or tobacco, and drug use.  Access to firearms.  Diet, exercise, and sleep habits.  Work and work Astronomer.  Sunscreen use.  Safety issues such as seatbelt and bike helmet use.  Physical exam  Your health care provider will check your:  Height and weight. These may be used to calculate your BMI (body mass index). BMI is a measurement that tells if you are at a healthy weight.  Waist circumference. This measures the distance around your waistline. This measurement also tells if you are at a healthy weight and may help predict your risk of certain diseases, such as type 2 diabetes and high blood pressure.  Heart rate and blood pressure.  Body temperature.  Skin for abnormal spots.  What immunizations do I need?    Vaccines are usually given at various ages, according to a schedule. Your health care provider will recommend vaccines for you based on your age, medical history, and lifestyle or other factors, such as travel or where you work.  What tests do I need?  Screening  Your health care provider may recommend screening tests for certain conditions. This may include:  Lipid and cholesterol levels.  Diabetes screening. This is done by checking your blood sugar (glucose) after you have not eaten for a while (fasting).  Pelvic exam and Pap test.  Hepatitis B test.  Hepatitis C  test.  HIV (human immunodeficiency virus) test.  STI (sexually transmitted infection) testing, if you are at risk.  Lung cancer screening.  Colorectal cancer screening.  Mammogram. Talk with your health care provider about when you should start having regular mammograms. This may depend on whether you have a family history of breast cancer.  BRCA-related cancer screening. This may be done if you have a family history of breast, ovarian, tubal, or peritoneal cancers.  Bone density scan. This is done to screen for osteoporosis.  Talk with your health care provider about your test results, treatment options, and if necessary, the need for more tests.  Follow these instructions at home:  Eating and drinking    Eat a diet that includes fresh fruits and vegetables, whole grains, lean protein, and low-fat dairy products.  Take vitamin and mineral supplements as recommended by your health care provider.  Do not drink alcohol if:  Your health care provider tells you not to drink.  You are pregnant, may be pregnant, or are planning to become pregnant.  If you drink alcohol:  Limit how much you have to 0-1 drink a day.  Know how much alcohol is in your drink. In the [place] one drink equals one 12 oz bottle of beer (355 mL), one 5 oz glass of wine (148 mL), or one 1 oz glass of hard liquor (44 mL).  Lifestyle  Brush your teeth every morning and night with fluoride toothpaste. Floss one time each day.  Exercise for at least  30 minutes 5 or more days each week.  Do not use any products that contain nicotine or tobacco. These products include cigarettes, chewing tobacco, and vaping devices, such as e-cigarettes. If you need help quitting, ask your health care provider.  Do not use drugs.  If you are sexually active, practice safe sex. Use a condom or other form of protection to prevent STIs.  If you do not wish to become pregnant, use a form of birth control. If you plan to become pregnant, see your health care provider for a  prepregnancy visit.  Take aspirin only as told by your health care provider. Make sure that you understand how much to take and what form to take. Work with your health care provider to find out whether it is safe and beneficial for you to take aspirin daily.  Find healthy ways to manage stress, such as:  Meditation, yoga, or listening to music.  Journaling.  Talking to a trusted person.  Spending time with friends and family.  Minimize exposure to UV radiation to reduce your risk of skin cancer.  Safety  Always wear your seat belt while driving or riding in a vehicle.  Do not drive:  If you have been drinking alcohol. Do not ride with someone who has been drinking.  When you are tired or distracted.  While texting.  If you have been using any mind-altering substances or drugs.  Wear a helmet and other protective equipment during sports activities.  If you have firearms in your house, make sure you follow all gun safety procedures.  Seek help if you have been physically or sexually abused.  What's next?  Visit your health care provider once a year for an annual wellness visit.  Ask your health care provider how often you should have your eyes and teeth checked.  Stay up to date on all vaccines.  This information is not intended to replace advice given to you by your health care provider. Make sure you discuss any questions you have with your health care provider.  Document Revised: 06/20/2020 Document Reviewed: 06/20/2020  Elsevier Patient Education  2024 [employer].

## 2023-05-05 NOTE — Progress Notes (Signed)
 Alexis [name] Jul 29, 1975 [id number]   History:  48 y.o. G2P2 presents for annual exam.IUD was placed last year after D+C, no concerns. Does not have a PCP.  Gynecologic History Patient's last menstrual period was 04/28/2023 (approximate).   Contraception/Family planning: IUD, BTL Sexually active: yes Last Pap: 2024. Results were: normal Last mammogram: never  Obstetric History OB History  Gravida Para Term Preterm AB Living  2 2    2   SAB IAB Ectopic Multiple Live Births          # Outcome Date GA Lbr Len/2nd Weight Sex Type Anes PTL Lv  2 Para           1 Para                05/05/2023    3:43 PM  Depression screen PHQ 2/9  Decreased Interest 1  Down, Depressed, Hopeless 0  PHQ - 2 Score 1     The following portions of the patient's history were reviewed and updated as appropriate: allergies, current medications, past family history, past medical history, past social history, past surgical history, and problem list.  Review of Systems  All other systems reviewed and are negative.   Past medical history, past surgical history, family history and social history were all reviewed and documented in the EPIC chart.  Exam:  Vitals:   05/05/23 1543  BP: 114/66  Pulse: 88  SpO2: 96%  Weight: 128 lb (58.1 kg)  Height: 5' 5.5" (1.664 m)   Body mass index is 20.98 kg/m.  Physical Exam Vitals and nursing note reviewed. Exam conducted with a chaperone present.  Constitutional:      Appearance: Normal appearance. She is normal weight.  HENT:     Head: Normocephalic and atraumatic.  Neck:     Thyroid : No thyroid  mass, thyromegaly or thyroid  tenderness.  Cardiovascular:     Rate and Rhythm: Regular rhythm.     Heart sounds: Normal heart sounds.  Pulmonary:     Effort: Pulmonary effort is normal.     Breath sounds: Normal breath sounds.  Chest:  Breasts:    Breasts are symmetrical.     Right: Normal. No inverted nipple, mass, nipple discharge, skin change  or tenderness.     Left: Normal. No inverted nipple, mass, nipple discharge, skin change or tenderness.  Abdominal:     General: Abdomen is flat. Bowel sounds are normal.     Palpations: Abdomen is soft.  Genitourinary:    General: Normal vulva.     Vagina: Normal. No vaginal discharge, bleeding or lesions.     Cervix: Normal. No discharge or lesion.     Uterus: Normal. Not enlarged and not tender.      Adnexa: Right adnexa normal and left adnexa normal.       Right: No mass, tenderness or fullness.         Left: No mass, tenderness or fullness.       Comments: IUD string seen at os Lymphadenopathy:     Upper Body:     Right upper body: No axillary adenopathy.     Left upper body: No axillary adenopathy.  Skin:    General: Skin is warm and dry.  Neurological:     Mental Status: She is alert and oriented to person, place, and time.  Psychiatric:        Mood and Affect: Mood normal.        Thought Content: Thought content normal.  Judgment: Judgment normal.      Ellis Guys, CMA present for exam  Assessment/Plan:   1. Well woman exam with routine gynecological exam (Primary) Pap 2027 Schedule screening mammogram  2. IUD (intrauterine device) in place May remain until 12/2030  3. Colon cancer screening - Cologuard    Discussed SBE, colonoscopy and mammogram screening as directed/appropriate. Recommend of exercise weekly, including weight bearing exercise.   Return in about 1 year (around 05/04/2024) for Annual.  Synetta Eves B WHNP-BC 3:55 PM 05/05/2023

## 2023-05-06 ENCOUNTER — Other Ambulatory Visit: Payer: Self-pay | Admitting: Radiology

## 2023-05-06 DIAGNOSIS — Z1231 Encounter for screening mammogram for malignant neoplasm of breast: Secondary | ICD-10-CM

## 2023-05-14 LAB — COLOGUARD: COLOGUARD: NEGATIVE

## 2023-05-15 ENCOUNTER — Ambulatory Visit
Admission: RE | Admit: 2023-05-15 | Discharge: 2023-05-15 | Disposition: A | Discharge status: Home / Self Care | Source: Ambulatory Visit

## 2023-05-15 DIAGNOSIS — Z1231 Encounter for screening mammogram for malignant neoplasm of breast: Secondary | ICD-10-CM

## 2023-07-13 ENCOUNTER — Encounter: Payer: Self-pay | Admitting: Family

## 2023-07-31 ENCOUNTER — Ambulatory Visit (INDEPENDENT_AMBULATORY_CARE_PROVIDER_SITE_OTHER)

## 2023-07-31 ENCOUNTER — Ambulatory Visit (INDEPENDENT_AMBULATORY_CARE_PROVIDER_SITE_OTHER): Admitting: Family

## 2023-07-31 ENCOUNTER — Encounter: Payer: Self-pay | Admitting: Family

## 2023-07-31 VITALS — BP 104/70 | HR 77 | Temp 98.0°F | Resp 16 | Ht 67.0 in | Wt 120.2 lb

## 2023-07-31 DIAGNOSIS — M25562 Pain in left knee: Secondary | ICD-10-CM

## 2023-07-31 DIAGNOSIS — Z7689 Persons encountering health services in other specified circumstances: Secondary | ICD-10-CM

## 2023-07-31 DIAGNOSIS — F32A Depression, unspecified: Secondary | ICD-10-CM

## 2023-07-31 DIAGNOSIS — F418 Other specified anxiety disorders: Secondary | ICD-10-CM

## 2023-07-31 MED ORDER — HYDROXYZINE PAMOATE 25 MG PO CAPS: 25.0000 mg | ORAL_CAPSULE | Freq: Three times a day (TID) | ORAL | 1 refills | Status: DC | PRN

## 2023-07-31 MED ORDER — ESCITALOPRAM OXALATE 5 MG PO TABS: 5.0000 mg | ORAL_TABLET | Freq: Every day | ORAL | 0 refills | Status: AC

## 2023-07-31 MED ORDER — MELOXICAM 7.5 MG PO TABS
7.5000 mg | ORAL_TABLET | Freq: Every day | ORAL | 0 refills | Status: AC
Start: 2023-07-31 — End: ?

## 2023-07-31 NOTE — Progress Notes (Signed)
 Subjective:    Alexis [name] - 48 y.o. female MRN [id number]  Date of birth: 09/28/1975  HPI  Alexis [name] is to establish care.   Current issues and/or concerns: - Anxiety depression primarily related to her job. She would like to try medication to see if this helps. She denies thoughts of self-harm, suicidal ideations, homicidal ideations. - Left knee pain persisting especially while bending. Denies recent trauma/injury and red flag symptoms. - No further issues/concerns for discussion today.   ROS per HPI    Health Maintenance:   Health Maintenance Due  Topic Date Due   Hepatitis C Screening  Never done   Colonoscopy  Never done     Past Medical History: Patient Active Problem List   Diagnosis Date Noted   Uses hormone releasing intrauterine device (IUD) for contraception 02/16/2023   Menorrhagia with irregular cycle 11/24/2022   Iron  deficiency anemia 12/18/2021   Symptomatic anemia 12/10/2021   Hypokalemia 12/10/2021   Acute lower UTI 12/10/2021      Social History   reports that she has never smoked. She has never been exposed to tobacco smoke. She has never used smokeless tobacco. She reports that she does not drink alcohol and does not use drugs.   Family History  family history includes Arthritis in her mother; Diabetes in her mother; Hypertension in her brother, father, and mother; Multiple sclerosis in her father; Other in her mother.   Medications: reviewed and updated   Objective:   Physical Exam BP 104/70   Pulse 77   Temp 98 F (36.7 C) (Oral)   Resp 16   Ht 5' 7 (1.702 m)   Wt 120 lb 3.2 oz (54.5 kg)   LMP 06/09/2023 (Exact Date)   SpO2 97%   BMI 18.83 kg/m   Physical Exam HENT:     Head: Normocephalic and atraumatic.     Nose: Nose normal.     Mouth/Throat:     Mouth: Mucous membranes are moist.     Pharynx: Oropharynx is clear.  Eyes:     Extraocular Movements: Extraocular movements intact.     Conjunctiva/sclera:  Conjunctivae normal.     Pupils: Pupils are equal, round, and reactive to light.  Cardiovascular:     Rate and Rhythm: Normal rate and regular rhythm.     Pulses: Normal pulses.     Heart sounds: Normal heart sounds.  Pulmonary:     Effort: Pulmonary effort is normal.     Breath sounds: Normal breath sounds.  Musculoskeletal:        General: Normal range of motion.     Cervical back: Normal range of motion and neck supple.     Right hip: Normal.     Left hip: Normal.     Right upper leg: Normal.     Left upper leg: Normal.     Right knee: Normal.     Left knee: Normal.     Right lower leg: Normal.     Left lower leg: Normal.     Right ankle: Normal.     Left ankle: Normal.     Right foot: Normal.     Left foot: Normal.  Neurological:     General: No focal deficit present.     Mental Status: She is alert and oriented to person, place, and time.  Psychiatric:        Mood and Affect: Mood normal.        Behavior: Behavior normal.  Assessment & Plan:  1. Encounter to establish care (Primary) - Patient presents today to establish care. During the interim follow-up with primary provider as scheduled.  - Return for annual physical examination, labs, and health maintenance. Arrive fasting meaning having no food for at least 8 hours prior to appointment. You may have only water or black coffee. Please take scheduled medications as normal.  2. Anxiety and depression - Patient denies thoughts of self-harm, suicidal ideations, homicidal ideations. - Escitalopram and Hydroxyzine as prescribed. Counseled on medication adherence/adverse effects. - Patient declined referral to Psychiatry.  - Follow-up with primary provider in 4 weeks or sooner if needed. - escitalopram (LEXAPRO) 5 MG tablet; Take 1 tablet (5 mg total) by mouth daily.  Dispense: 90 tablet; Refill: 0 - hydrOXYzine (VISTARIL) 25 MG capsule; Take 1 capsule (25 mg total) by mouth every 8 (eight) hours as needed.  Dispense:  90 capsule; Refill: 1  3. Left knee pain, unspecified chronicity - Meloxicam as prescribed. Counseled on medication adherence/adverse effects.  - Diagnostic xray left knee for evaluation.  - Referral to Orthopedic Surgery for evaluation/management.  - Follow-up with primary provider as scheduled. - DG Knee Complete 4 Views Left; Future - Ambulatory referral to Orthopedic Surgery - meloxicam (MOBIC) 7.5 MG tablet; Take 1 tablet (7.5 mg total) by mouth daily.  Dispense: 90 tablet; Refill: 0    Patient was given clear instructions to go to Emergency Department or return to medical center if symptoms don't improve, worsen, or new problems develop.The patient verbalized understanding.  I discussed the assessment and treatment plan with the patient. The patient was provided an opportunity to ask questions and all were answered. The patient agreed with the plan and demonstrated an understanding of the instructions.   The patient was advised to call back or seek an in-person evaluation if the symptoms worsen or if the condition fails to improve as anticipated.    Greig Drones, NP 07/31/2023, 4:35 PM Primary Care at [facility]

## 2023-07-31 NOTE — Progress Notes (Signed)
 Left leg starts hurting if she walks on the cement for a very long time, patient scored a 10 on the PHQ-9, patient scored a 11 on the GAD-7

## 2023-08-03 ENCOUNTER — Ambulatory Visit: Payer: Self-pay | Admitting: Family

## 2023-08-07 ENCOUNTER — Other Ambulatory Visit: Payer: Self-pay | Admitting: *Deleted

## 2023-08-07 DIAGNOSIS — D509 Iron deficiency anemia, unspecified: Secondary | ICD-10-CM

## 2023-08-10 ENCOUNTER — Telehealth: Payer: Self-pay | Admitting: Oncology

## 2023-08-10 ENCOUNTER — Inpatient Hospital Stay

## 2023-08-10 NOTE — Telephone Encounter (Signed)
 Per secure chat, I was told to call the pt to get her appts rescheduled due to her not showing up. I called the pt and left a vm for her to return my call so I can get her rescheduled.

## 2023-08-11 ENCOUNTER — Inpatient Hospital Stay

## 2023-08-11 ENCOUNTER — Inpatient Hospital Stay: Admitting: Oncology

## 2023-08-12 ENCOUNTER — Ambulatory Visit

## 2023-08-12 ENCOUNTER — Ambulatory Visit: Admitting: Oncology

## 2023-08-13 ENCOUNTER — Encounter: Payer: Self-pay | Admitting: Orthopaedic Surgery

## 2023-08-13 ENCOUNTER — Ambulatory Visit (INDEPENDENT_AMBULATORY_CARE_PROVIDER_SITE_OTHER): Admitting: Orthopaedic Surgery

## 2023-08-13 ENCOUNTER — Encounter: Payer: Self-pay | Admitting: Oncology

## 2023-08-13 DIAGNOSIS — M1712 Unilateral primary osteoarthritis, left knee: Secondary | ICD-10-CM | POA: Diagnosis not present

## 2023-08-13 NOTE — Progress Notes (Signed)
 Office Visit Note   Patient: Alexis [name]           Date of Birth: 03-30-1975           MRN: [id number] Visit Date: 08/13/2023              Requested by: Lorren Greig PARAS, NP [address] [place],  [place] [address] PCP: Lorren Greig PARAS, NP   Assessment & Plan: Visit Diagnoses:  1. Primary osteoarthritis of left knee     Plan: History of Present Illness Alexis [name] is a 48 year old female who presents with left knee pain and loss of flexibility.  She has experienced left knee pain and loss of flexibility for about a year without any specific injury. The pain is localized to the sides of the knee and occurs during knee flexion. There is no swelling, but she observes a size difference between her knees.  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  She has a family history of osteoarthritis. She has used meloxicam  and ibuprofen  for pain management and recently picked up meloxicam  from the pharmacy.  Physical Exam MUSCULOSKELETAL: Knee examination unremarkable. No significant swelling difference between knees. Knee flexibility normal. Pain on full flexion of left knee.  Results RADIOLOGY Left knee X-ray: Early osteoarthritic changes, small osteophytes, no bone-on-bone contact  Assessment and Plan Left knee pain with early osteoarthritic changes Left knee pain with early osteoarthritic changes confirmed by X-ray. Current management with meloxicam  provides partial relief. - Prescribe meloxicam  15 mg per day as needed, not exceeding 15 mg per day. - Recommend compression knee brace during activities. - Provide knee strengthening exercises. - Discuss cortisone injection if symptoms persist.  Follow-Up Instructions: No follow-ups on file.   Orders:  No orders of the defined types were placed in this encounter.  No orders of the defined types were placed in this  encounter.     Procedures: No procedures performed   Clinical Data: No additional findings.   Subjective: Chief Complaint  Patient presents with   Left Knee - Pain    HPI  Review of Systems  Constitutional: Negative.   HENT: Negative.    Eyes: Negative.   Respiratory: Negative.    Cardiovascular: Negative.   Endocrine: Negative.   Musculoskeletal: Negative.   Neurological: Negative.   Hematological: Negative.   Psychiatric/Behavioral: Negative.    All other systems reviewed and are negative.    Objective: Vital Signs: LMP 06/09/2023 (Exact Date)   Physical Exam Vitals and nursing note reviewed.  Constitutional:      Appearance: She is well-developed.  HENT:     Head: Atraumatic.     Nose: Nose normal.  Eyes:     Extraocular Movements: Extraocular movements intact.  Cardiovascular:     Pulses: Normal pulses.  Pulmonary:     Effort: Pulmonary effort is normal.  Abdominal:     Palpations: Abdomen is soft.  Musculoskeletal:     Cervical back: Neck supple.  Skin:    General: Skin is warm.     Capillary Refill: Capillary refill takes less than 2 seconds.  Neurological:     Mental Status: She is alert. Mental status is at baseline.  Psychiatric:        Behavior: Behavior normal.        Thought Content: Thought content normal.        Judgment: Judgment  normal.     Ortho Exam  Specialty Comments:  No specialty comments available.  Imaging: No results found.   PMFS History: Patient Active Problem List   Diagnosis Date Noted   Uses hormone releasing intrauterine device (IUD) for contraception 02/16/2023   Menorrhagia with irregular cycle 11/24/2022   Iron  deficiency anemia 12/18/2021   Symptomatic anemia 12/10/2021   Hypokalemia 12/10/2021   Acute lower UTI 12/10/2021   Past Medical History:  Diagnosis Date   Anemia    Influenza A 12/10/2021    Family History  Problem Relation Age of Onset   Hypertension Mother    Diabetes Mother     Arthritis Mother    Other Mother        spine disease   Hypertension Father    Multiple sclerosis Father    Hypertension Brother    BRCA 1/2 Neg Hx    Breast cancer Neg Hx     Past Surgical History:  Procedure Laterality Date   DILATATION & CURETTAGE/HYSTEROSCOPY WITH MYOSURE N/A 12/16/2022   Procedure: DILATATION & CURETTAGE/HYSTEROSCOPY WITH MYOSURE;  Surgeon: Dallie Vera GAILS, MD;  Location: [facility];  Service: Gynecology;  Laterality: N/A;   INTRAUTERINE DEVICE (IUD) INSERTION N/A 12/16/2022   Procedure: Mirena  INTRAUTERINE DEVICE (IUD) INSERTION;  Surgeon: Dallie Vera GAILS, MD;  Location: [facility] [place];  Service: Gynecology;  Laterality: N/A;   NO PAST SURGERIES     TUBAL LIGATION     Social History   Occupational History   Not on file  Tobacco Use   Smoking status: Never    Passive exposure: Never   Smokeless tobacco: Never  Vaping Use   Vaping status: Never Used  Substance and Sexual Activity   Alcohol use: No   Drug use: Never   Sexual activity: Yes    Partners: Male    Birth control/protection: Surgical, I.U.D.    Comment: tubal, menarche 48yo, sexual debut 48yo

## 2023-09-08 ENCOUNTER — Inpatient Hospital Stay: Payer: Self-pay | Attending: Oncology

## 2023-09-08 DIAGNOSIS — D509 Iron deficiency anemia, unspecified: Secondary | ICD-10-CM | POA: Insufficient documentation

## 2023-09-08 LAB — CBC WITH DIFFERENTIAL/PLATELET
Abs Immature Granulocytes: 0.02 K/uL (ref 0.00–0.07)
Basophils Absolute: 0 K/uL (ref 0.0–0.1)
Basophils Relative: 1 %
Eosinophils Absolute: 0 K/uL (ref 0.0–0.5)
Eosinophils Relative: 1 %
HCT: 40.8 % (ref 36.0–46.0)
Hemoglobin: 13.4 g/dL (ref 12.0–15.0)
Immature Granulocytes: 0 %
Lymphocytes Relative: 13 %
Lymphs Abs: 0.8 K/uL (ref 0.7–4.0)
MCH: 28.1 pg (ref 26.0–34.0)
MCHC: 32.8 g/dL (ref 30.0–36.0)
MCV: 85.5 fL (ref 80.0–100.0)
Monocytes Absolute: 0.4 K/uL (ref 0.1–1.0)
Monocytes Relative: 6 %
Neutro Abs: 4.7 K/uL (ref 1.7–7.7)
Neutrophils Relative %: 79 %
Platelets: 260 K/uL (ref 150–400)
RBC: 4.77 MIL/uL (ref 3.87–5.11)
RDW: 12.5 % (ref 11.5–15.5)
WBC: 5.9 K/uL (ref 4.0–10.5)
nRBC: 0 % (ref 0.0–0.2)

## 2023-09-08 LAB — FERRITIN: Ferritin: 55 ng/mL (ref 11–307)

## 2023-09-08 LAB — IRON AND TIBC
Iron: 145 ug/dL (ref 28–170)
Saturation Ratios: 41 % — ABNORMAL HIGH (ref 10.4–31.8)
TIBC: 358 ug/dL (ref 250–450)
UIBC: 213 ug/dL

## 2023-09-09 ENCOUNTER — Inpatient Hospital Stay (HOSPITAL_BASED_OUTPATIENT_CLINIC_OR_DEPARTMENT_OTHER): Payer: Self-pay | Admitting: Oncology

## 2023-09-09 ENCOUNTER — Inpatient Hospital Stay: Payer: Self-pay

## 2023-09-09 ENCOUNTER — Encounter: Payer: Self-pay | Admitting: Oncology

## 2023-09-09 VITALS — BP 106/62 | HR 77 | Temp 97.8°F | Resp 18 | Ht 67.0 in | Wt 124.0 lb

## 2023-09-09 DIAGNOSIS — D509 Iron deficiency anemia, unspecified: Secondary | ICD-10-CM | POA: Diagnosis not present

## 2023-09-09 NOTE — Progress Notes (Signed)
 [facility]  [phone] [phone]  ID: Alexis [name] OB: 11-26-1975  MR#: [id number]  RDW#:[id number]  Patient Care Team: Lorren Greig PARAS, NP as PCP - General (Nurse Practitioner) Ginette Shasta NOVAK, NP as Nurse Practitioner (Radiology) Jacobo Alexis PARAS, MD as Consulting Physician (Oncology)  CHIEF COMPLAINT: Iron  deficiency anemia.  INTERVAL HISTORY: Patient returns to clinic today for repeat laboratory work, further evaluation, and consideration of additional IV Venofer .  She has mild fatigue, but otherwise feels well. She has no neurologic complaints.  She denies any recent fevers or illnesses.  She has a good appetite and denies weight loss.  She has no chest pain, shortness of breath, cough, or hemoptysis.  She denies any nausea, vomiting, constipation, or diarrhea.  She has no melena or hematochezia.  She has no urinary complaints.  Patient offers no further specific complaints today.  REVIEW OF SYSTEMS:   Review of Systems  Constitutional:  Positive for malaise/fatigue. Negative for fever and weight loss.  Respiratory: Negative.  Negative for cough, hemoptysis and shortness of breath.   Cardiovascular: Negative.  Negative for chest pain and leg swelling.  Gastrointestinal: Negative.  Negative for abdominal pain, blood in stool and melena.  Genitourinary: Negative.  Negative for dysuria.  Musculoskeletal: Negative.  Negative for back pain.  Skin: Negative.  Negative for rash.  Neurological: Negative.  Negative for dizziness, focal weakness, weakness and headaches.  Psychiatric/Behavioral:  The patient is not nervous/anxious.     As per HPI. Otherwise, a complete review of systems is negative.  PAST MEDICAL HISTORY: Past Medical History:  Diagnosis Date   Anemia    Influenza A 12/10/2021    PAST SURGICAL HISTORY: Past Surgical History:  Procedure Laterality Date   DILATATION & CURETTAGE/HYSTEROSCOPY WITH MYOSURE N/A  12/16/2022   Procedure: DILATATION & CURETTAGE/HYSTEROSCOPY WITH MYOSURE;  Surgeon: Dallie Vera GAILS, MD;  Location: [facility] [place];  Service: Gynecology;  Laterality: N/A;   INTRAUTERINE DEVICE (IUD) INSERTION N/A 12/16/2022   Procedure: Mirena  INTRAUTERINE DEVICE (IUD) INSERTION;  Surgeon: Dallie Vera GAILS, MD;  Location: [facility];  Service: Gynecology;  Laterality: N/A;   NO PAST SURGERIES     TUBAL LIGATION      FAMILY HISTORY: Family History  Problem Relation Age of Onset   Hypertension Mother    Diabetes Mother    Arthritis Mother    Other Mother        spine disease   Hypertension Father    Multiple sclerosis Father    Hypertension Brother    BRCA 1/2 Neg Hx    Breast cancer Neg Hx     ADVANCED DIRECTIVES (Y/N):  N  HEALTH MAINTENANCE: Social History   Tobacco Use   Smoking status: Never    Passive exposure: Never   Smokeless tobacco: Never  Vaping Use   Vaping status: Never Used  Substance Use Topics   Alcohol use: No   Drug use: Never     Colonoscopy:  PAP:  Bone density:  Lipid panel:  No Known Allergies  Current Outpatient Medications  Medication Sig Dispense Refill   escitalopram  (LEXAPRO ) 5 MG tablet Take 1 tablet (5 mg total) by mouth daily. 90 tablet 0   hydrOXYzine  (VISTARIL ) 25 MG capsule Take 1 capsule (25 mg total) by mouth every 8 (eight) hours as needed. 90 capsule 1   ibuprofen  (ADVIL ) 800 MG tablet Take 1 tablet (800 mg total) by mouth every 8 (eight) hours as needed for up  to 42 doses. 42 tablet 0   levonorgestrel  (MIRENA ) 20 MCG/DAY IUD 1 each by Intrauterine route once.     meloxicam  (MOBIC ) 7.5 MG tablet Take 1 tablet (7.5 mg total) by mouth daily. 90 tablet 0   iron  polysaccharides (NIFEREX) 150 MG capsule Take 1 capsule (150 mg total) by mouth daily. (Patient not taking: Reported on 09/09/2023) 90 capsule 0   No current facility-administered medications for this visit.    OBJECTIVE: Vitals:   09/09/23  1005  BP: 106/62  Pulse: 77  Resp: 18  Temp: 97.8 F (36.6 C)  SpO2: 100%     Body mass index is 19.42 kg/m.    ECOG FS:0 - Asymptomatic  General: Well-developed, well-nourished, no acute distress. Eyes: Pink conjunctiva, anicteric sclera. HEENT: Normocephalic, moist mucous membranes. Lungs: No audible wheezing or coughing. Heart: Regular rate and rhythm. Abdomen: Soft, nontender, no obvious distention. Musculoskeletal: No edema, cyanosis, or clubbing. Neuro: Alert, answering all questions appropriately. Cranial nerves grossly intact. Skin: No rashes or petechiae noted. Psych: Normal affect.  LAB RESULTS:  Lab Results  Component Value Date   NA 137 12/13/2021   K 3.5 12/13/2021   CL 107 12/13/2021   CO2 26 12/13/2021   GLUCOSE 85 12/13/2021   BUN 9 12/13/2021   CREATININE 0.62 12/13/2021   CALCIUM 8.4 (L) 12/13/2021   PROT 7.9 12/10/2021   ALBUMIN 4.0 12/10/2021   AST 14 (L) 12/10/2021   ALT 9 12/10/2021   ALKPHOS 60 12/10/2021   BILITOT 1.0 12/10/2021   GFRNONAA >60 12/13/2021    Lab Results  Component Value Date   WBC 5.9 09/08/2023   NEUTROABS 4.7 09/08/2023   HGB 13.4 09/08/2023   HCT 40.8 09/08/2023   MCV 85.5 09/08/2023   PLT 260 09/08/2023   Lab Results  Component Value Date   IRON  145 09/08/2023   TIBC 358 09/08/2023   IRONPCTSAT 41 (H) 09/08/2023   Lab Results  Component Value Date   FERRITIN 55 09/08/2023     STUDIES: No results found.  ASSESSMENT: Iron  deficiency anemia.  PLAN:    Iron  deficiency anemia: Patient's hemoglobin and iron  stores continue to be within normal limits.  She does not require additional IV Venofer  today.  Patient last received treatment on December 18, 2022.  No intervention is needed.  Return to clinic in 6 months with repeat laboratory, further evaluation, and consideration of additional treatment if necessary.  If patient's laboratory work continues to remain within normal limits, can consider discharging  patient from clinic.  I spent a total of 20 minutes reviewing chart data, face-to-face evaluation with the patient, counseling and coordination of care as detailed above.    Patient expressed understanding and was in agreement with this plan. She also understands that She can call clinic at any time with any questions, concerns, or complaints.    Alexis JINNY Reusing, MD   09/09/2023 1:34 PM

## 2023-09-09 NOTE — Progress Notes (Signed)
 Patient is doing ok. No new questions for the doctor today.

## 2023-10-04 ENCOUNTER — Other Ambulatory Visit: Payer: Self-pay | Admitting: Family

## 2023-10-04 DIAGNOSIS — F32A Depression, unspecified: Secondary | ICD-10-CM

## 2023-10-05 NOTE — Telephone Encounter (Signed)
 Complete

## 2023-10-20 ENCOUNTER — Encounter: Payer: Self-pay | Admitting: Oncology

## 2023-10-20 ENCOUNTER — Encounter: Payer: Self-pay | Admitting: Radiology

## 2023-10-20 ENCOUNTER — Ambulatory Visit (INDEPENDENT_AMBULATORY_CARE_PROVIDER_SITE_OTHER): Payer: Self-pay | Admitting: Radiology

## 2023-10-20 VITALS — BP 108/68 | Wt 130.0 lb

## 2023-10-20 DIAGNOSIS — Z113 Encounter for screening for infections with a predominantly sexual mode of transmission: Secondary | ICD-10-CM | POA: Diagnosis not present

## 2023-10-20 NOTE — Progress Notes (Signed)
      Subjective: Alexis [name] is a 48 y.o. female here for STI screening. Partner was unfaithful. No symptoms.      Review of Systems  All other systems reviewed and are negative.   Past Medical History:  Diagnosis Date   Anemia    Influenza A 12/10/2021      Objective:  Today's Vitals   10/20/23 1026  BP: 108/68  Weight: 130 lb (59 kg)   Body mass index is 20.36 kg/m.   Physical Exam Vitals and nursing note reviewed. Exam conducted with a chaperone present.  Constitutional:      Appearance: Normal appearance. She is well-developed.  Pulmonary:     Effort: Pulmonary effort is normal.  Abdominal:     General: Abdomen is flat.     Palpations: Abdomen is soft.  Genitourinary:    General: Normal vulva.     Vagina: Vaginal discharge present. No erythema, bleeding or lesions.     Cervix: Normal. No discharge, friability, lesion or erythema.     Uterus: Normal.      Adnexa: Right adnexa normal and left adnexa normal.  Neurological:     Mental Status: She is alert.  Psychiatric:        Mood and Affect: Mood normal.        Thought Content: Thought content normal.        Judgment: Judgment normal.    Darice Hoit, CMA present for exam  Assessment:/Plan:   1. Screening for STDs (sexually transmitted diseases) (Primary) - SURESWAB CT/NG/T. vaginalis - HIV Antibody (routine testing w rflx) - RPR - Hepatitis C antibody    Safe sex encouraged. Avoid the use of soaps or perfumed products in the peri area. Avoid tub baths and sitting in sweaty or wet clothing for prolonged periods of time.   No follow-ups on file.   Sapphire Tygart B, NP 10:42 AM

## 2023-10-21 LAB — HIV ANTIBODY (ROUTINE TESTING W REFLEX)
HIV 1&2 Ab, 4th Generation: NONREACTIVE
HIV FINAL INTERPRETATION: NEGATIVE

## 2023-10-21 LAB — HEPATITIS C ANTIBODY: Hepatitis C Ab: NONREACTIVE

## 2023-10-21 LAB — SURESWAB CT/NG/T. VAGINALIS
C. trachomatis RNA, TMA: NOT DETECTED
N. gonorrhoeae RNA, TMA: NOT DETECTED
Trichomonas vaginalis RNA: NOT DETECTED

## 2023-10-21 LAB — RPR: RPR Ser Ql: NONREACTIVE

## 2023-10-22 ENCOUNTER — Ambulatory Visit: Payer: Self-pay | Admitting: Radiology

## 2023-11-01 ENCOUNTER — Other Ambulatory Visit: Payer: Self-pay | Admitting: Family

## 2023-11-01 DIAGNOSIS — F419 Anxiety disorder, unspecified: Secondary | ICD-10-CM

## 2023-11-02 NOTE — Telephone Encounter (Signed)
 Complete

## 2023-11-09 ENCOUNTER — Encounter: Payer: Self-pay | Admitting: Radiology

## 2023-12-04 ENCOUNTER — Other Ambulatory Visit: Payer: Self-pay | Admitting: Family

## 2023-12-04 DIAGNOSIS — F32A Depression, unspecified: Secondary | ICD-10-CM

## 2023-12-07 NOTE — Telephone Encounter (Signed)
 Complete

## 2024-01-03 ENCOUNTER — Other Ambulatory Visit: Payer: Self-pay | Admitting: Family

## 2024-01-03 DIAGNOSIS — F419 Anxiety disorder, unspecified: Secondary | ICD-10-CM

## 2024-03-08 ENCOUNTER — Other Ambulatory Visit

## 2024-03-09 ENCOUNTER — Ambulatory Visit: Admitting: Oncology

## 2024-03-09 ENCOUNTER — Ambulatory Visit
